# Patient Record
Sex: Female | Born: 1970
Health system: Southern US, Community
[De-identification: ages and names within clinical notes are randomized; demographics above are authoritative.]

## PROBLEM LIST (undated history)

## (undated) DIAGNOSIS — B86 Scabies: Secondary | ICD-10-CM

## (undated) DIAGNOSIS — J302 Other seasonal allergic rhinitis: Secondary | ICD-10-CM

## (undated) HISTORY — DX: Scabies: B86

## (undated) HISTORY — PX: CERVICAL BIOPSY  W/ LOOP ELECTRODE EXCISION: SUR135

## (undated) HISTORY — PX: WISDOM TOOTH EXTRACTION: SHX21

## (undated) HISTORY — DX: Other seasonal allergic rhinitis: J30.2

## (undated) HISTORY — PX: COLPOSCOPY: SHX161

## (undated) HISTORY — PX: APPENDECTOMY: SHX54

## (undated) HISTORY — PX: PELVIC LAPAROSCOPY: SHX162

---

## 1998-02-02 ENCOUNTER — Other Ambulatory Visit: Admission: RE | Admit: 1998-02-02 | Discharge: 1998-02-02 | Payer: Self-pay | Admitting: Gynecology

## 1998-11-08 ENCOUNTER — Encounter: Payer: Self-pay | Admitting: Gynecology

## 1998-11-08 ENCOUNTER — Ambulatory Visit (HOSPITAL_COMMUNITY): Admission: RE | Admit: 1998-11-08 | Discharge: 1998-11-08 | Payer: Self-pay | Admitting: Gynecology

## 1999-03-15 ENCOUNTER — Other Ambulatory Visit: Admission: RE | Admit: 1999-03-15 | Discharge: 1999-03-15 | Payer: Self-pay | Admitting: Gynecology

## 1999-07-24 ENCOUNTER — Encounter (INDEPENDENT_AMBULATORY_CARE_PROVIDER_SITE_OTHER): Payer: Self-pay | Admitting: Specialist

## 1999-07-24 ENCOUNTER — Ambulatory Visit (HOSPITAL_COMMUNITY): Admission: RE | Admit: 1999-07-24 | Discharge: 1999-07-24 | Payer: Self-pay | Admitting: Gynecology

## 2000-01-24 ENCOUNTER — Inpatient Hospital Stay (HOSPITAL_COMMUNITY): Admission: AD | Admit: 2000-01-24 | Discharge: 2000-01-24 | Payer: Self-pay | Admitting: Gynecology

## 2000-02-17 ENCOUNTER — Encounter: Payer: Self-pay | Admitting: Obstetrics and Gynecology

## 2000-02-17 ENCOUNTER — Inpatient Hospital Stay (HOSPITAL_COMMUNITY): Admission: AD | Admit: 2000-02-17 | Discharge: 2000-02-17 | Payer: Self-pay | Admitting: *Deleted

## 2000-02-17 ENCOUNTER — Ambulatory Visit (HOSPITAL_COMMUNITY): Admission: RE | Admit: 2000-02-17 | Discharge: 2000-02-17 | Payer: Self-pay | Admitting: Gynecology

## 2000-03-13 ENCOUNTER — Inpatient Hospital Stay (HOSPITAL_COMMUNITY): Admission: AD | Admit: 2000-03-13 | Discharge: 2000-03-13 | Payer: Self-pay | Admitting: Gynecology

## 2001-08-05 ENCOUNTER — Other Ambulatory Visit: Admission: RE | Admit: 2001-08-05 | Discharge: 2001-08-05 | Payer: Self-pay | Admitting: Gynecology

## 2001-09-09 ENCOUNTER — Encounter: Admission: RE | Admit: 2001-09-09 | Discharge: 2001-12-08 | Payer: Self-pay | Admitting: Gynecology

## 2001-12-24 ENCOUNTER — Encounter (HOSPITAL_COMMUNITY): Admission: AD | Admit: 2001-12-24 | Discharge: 2002-01-23 | Payer: Self-pay | Admitting: Gynecology

## 2002-01-20 ENCOUNTER — Inpatient Hospital Stay (HOSPITAL_COMMUNITY): Admission: AD | Admit: 2002-01-20 | Discharge: 2002-01-23 | Payer: Self-pay | Admitting: Gynecology

## 2002-01-20 ENCOUNTER — Encounter (INDEPENDENT_AMBULATORY_CARE_PROVIDER_SITE_OTHER): Payer: Self-pay | Admitting: *Deleted

## 2002-01-24 ENCOUNTER — Encounter: Admission: RE | Admit: 2002-01-24 | Discharge: 2002-02-23 | Payer: Self-pay | Admitting: Gynecology

## 2002-03-03 ENCOUNTER — Other Ambulatory Visit: Admission: RE | Admit: 2002-03-03 | Discharge: 2002-03-03 | Payer: Self-pay | Admitting: Gynecology

## 2003-03-09 ENCOUNTER — Other Ambulatory Visit: Admission: RE | Admit: 2003-03-09 | Discharge: 2003-03-09 | Payer: Self-pay | Admitting: Gynecology

## 2003-09-13 ENCOUNTER — Other Ambulatory Visit: Admission: RE | Admit: 2003-09-13 | Discharge: 2003-09-13 | Payer: Self-pay | Admitting: Gynecology

## 2004-03-27 ENCOUNTER — Inpatient Hospital Stay (HOSPITAL_COMMUNITY): Admission: RE | Admit: 2004-03-27 | Discharge: 2004-03-30 | Payer: Self-pay | Admitting: Gynecology

## 2004-03-27 ENCOUNTER — Encounter (INDEPENDENT_AMBULATORY_CARE_PROVIDER_SITE_OTHER): Payer: Self-pay | Admitting: *Deleted

## 2004-05-07 ENCOUNTER — Other Ambulatory Visit: Admission: RE | Admit: 2004-05-07 | Discharge: 2004-05-07 | Payer: Self-pay | Admitting: Gynecology

## 2005-04-08 HISTORY — PX: ENDOMETRIAL ABLATION: SHX621

## 2005-05-08 ENCOUNTER — Other Ambulatory Visit: Admission: RE | Admit: 2005-05-08 | Discharge: 2005-05-08 | Payer: Self-pay | Admitting: Gynecology

## 2005-06-17 ENCOUNTER — Ambulatory Visit (HOSPITAL_COMMUNITY): Admission: RE | Admit: 2005-06-17 | Discharge: 2005-06-17 | Payer: Self-pay | Admitting: Gynecology

## 2005-06-17 ENCOUNTER — Encounter (INDEPENDENT_AMBULATORY_CARE_PROVIDER_SITE_OTHER): Payer: Self-pay | Admitting: *Deleted

## 2006-05-12 ENCOUNTER — Other Ambulatory Visit: Admission: RE | Admit: 2006-05-12 | Discharge: 2006-05-12 | Payer: Self-pay | Admitting: Gynecology

## 2006-09-04 ENCOUNTER — Emergency Department (HOSPITAL_COMMUNITY): Admission: EM | Admit: 2006-09-04 | Discharge: 2006-09-04 | Payer: Self-pay | Admitting: Emergency Medicine

## 2006-09-23 ENCOUNTER — Encounter: Admission: RE | Admit: 2006-09-23 | Discharge: 2006-09-23 | Payer: Self-pay | Admitting: Gynecology

## 2007-05-15 ENCOUNTER — Other Ambulatory Visit: Admission: RE | Admit: 2007-05-15 | Discharge: 2007-05-15 | Payer: Self-pay | Admitting: Gynecology

## 2007-09-24 ENCOUNTER — Encounter: Admission: RE | Admit: 2007-09-24 | Discharge: 2007-09-24 | Payer: Self-pay | Admitting: Gynecology

## 2008-02-07 IMAGING — MG MM SCREEN MAMMOGRAM BILATERAL
4 series · 4 of 4 positions shown · non-contrast
Comparison: none

DG SCREEN MAMMOGRAM BILATERAL
Bilateral CC and MLO view(s) were taken.

DIGITAL SCREENING MAMMOGRAM WITH CAD:
There is a  dense fibroglandular pattern.  No masses or malignant type calcifications are 
identified.  Compared with prior studies.

[R CC]
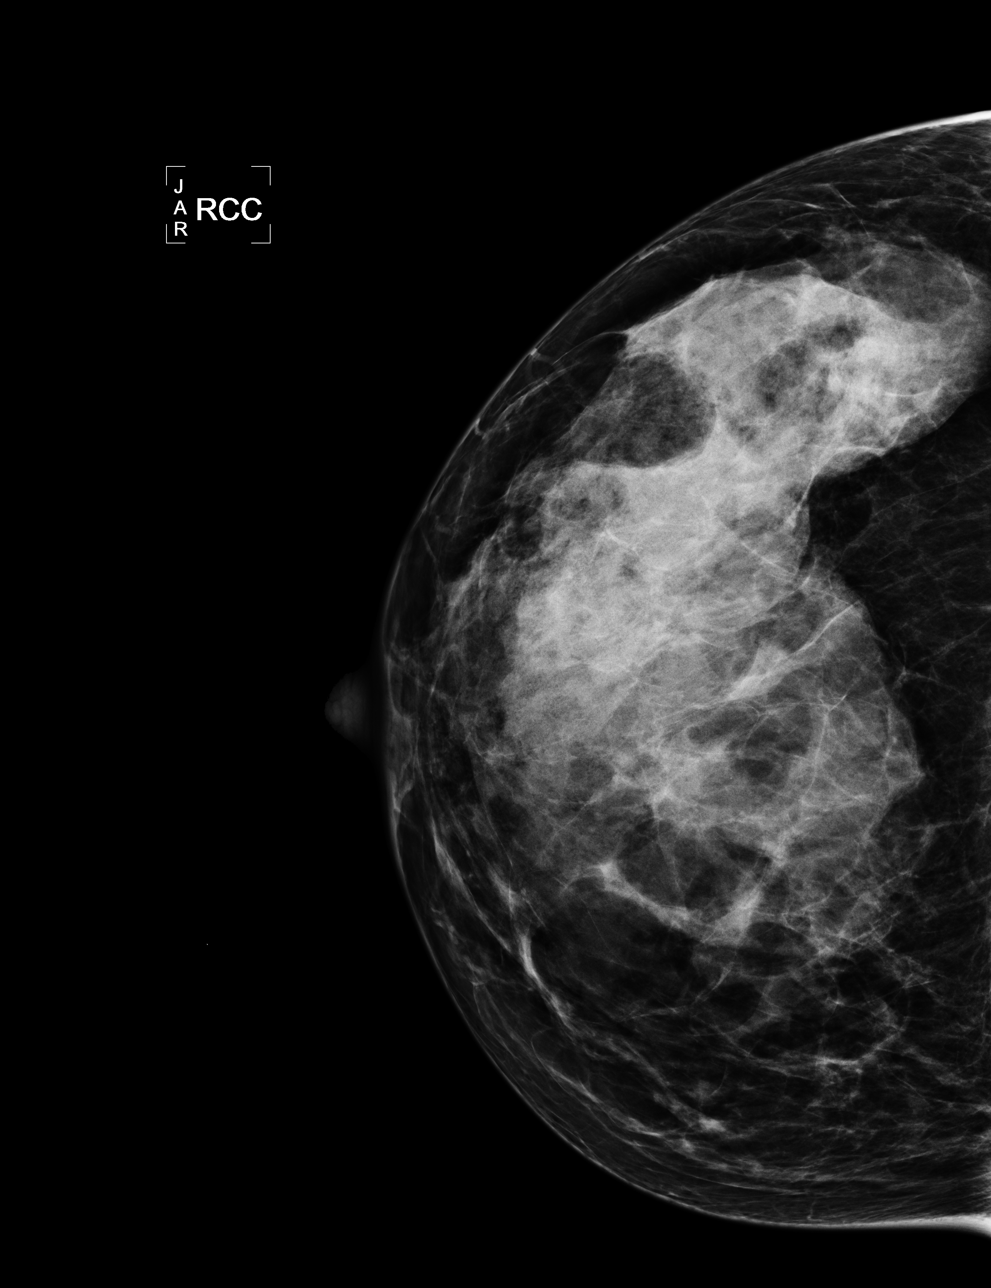

[L CC]
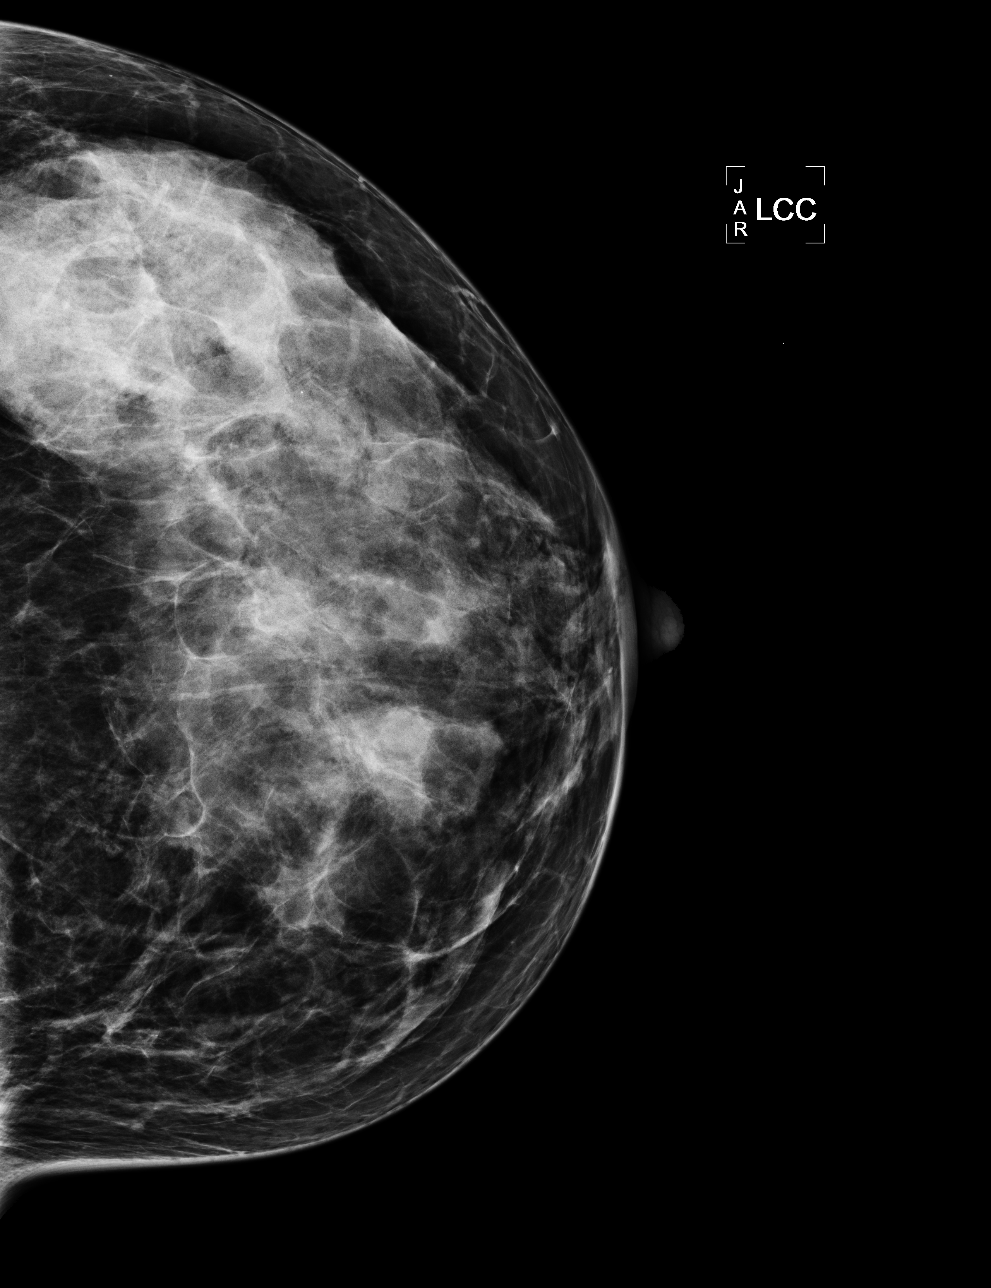

[L MLO]
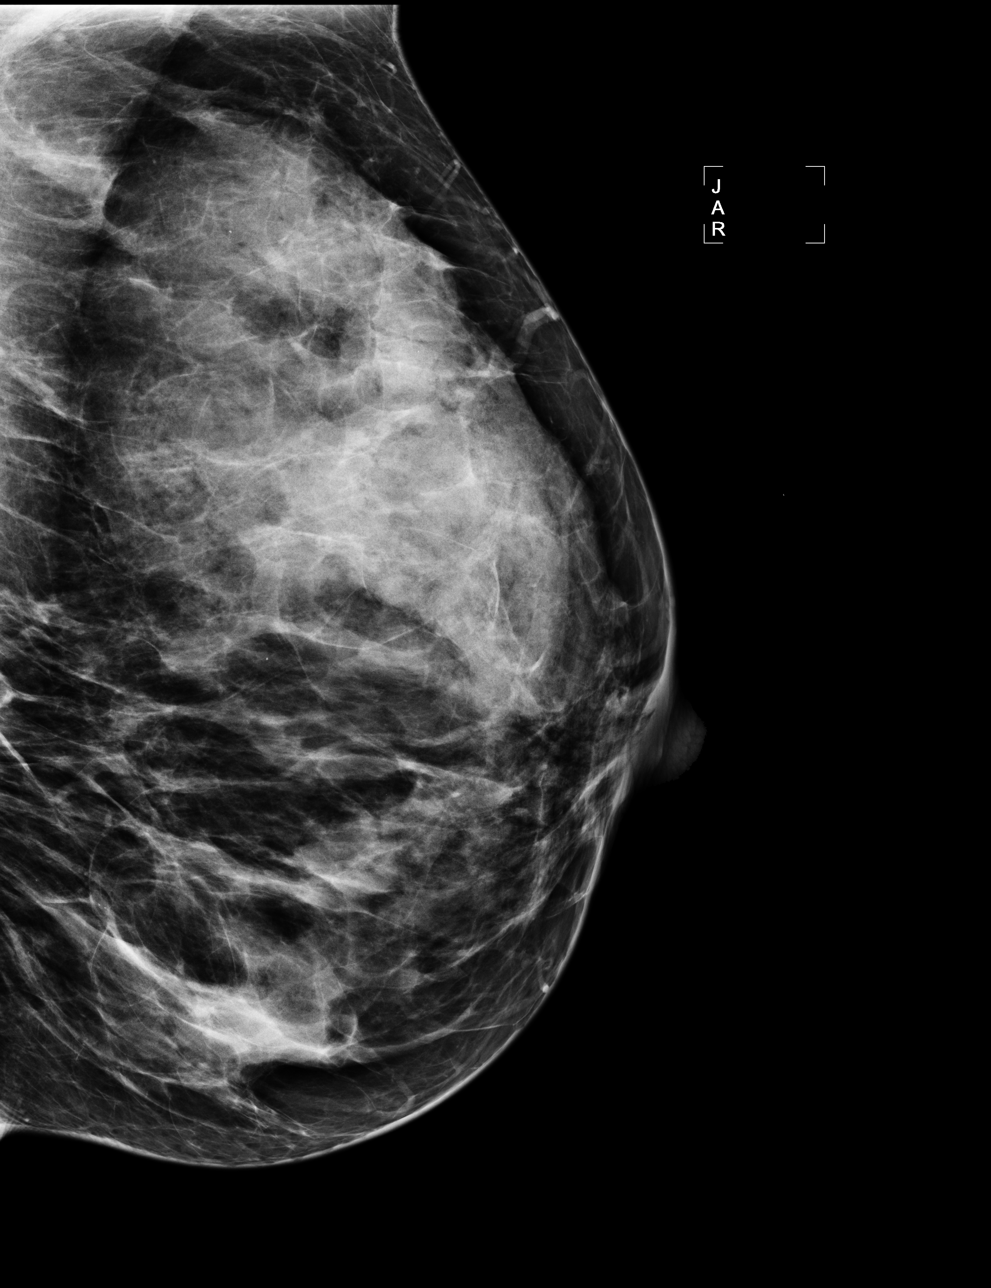

[R MLO]
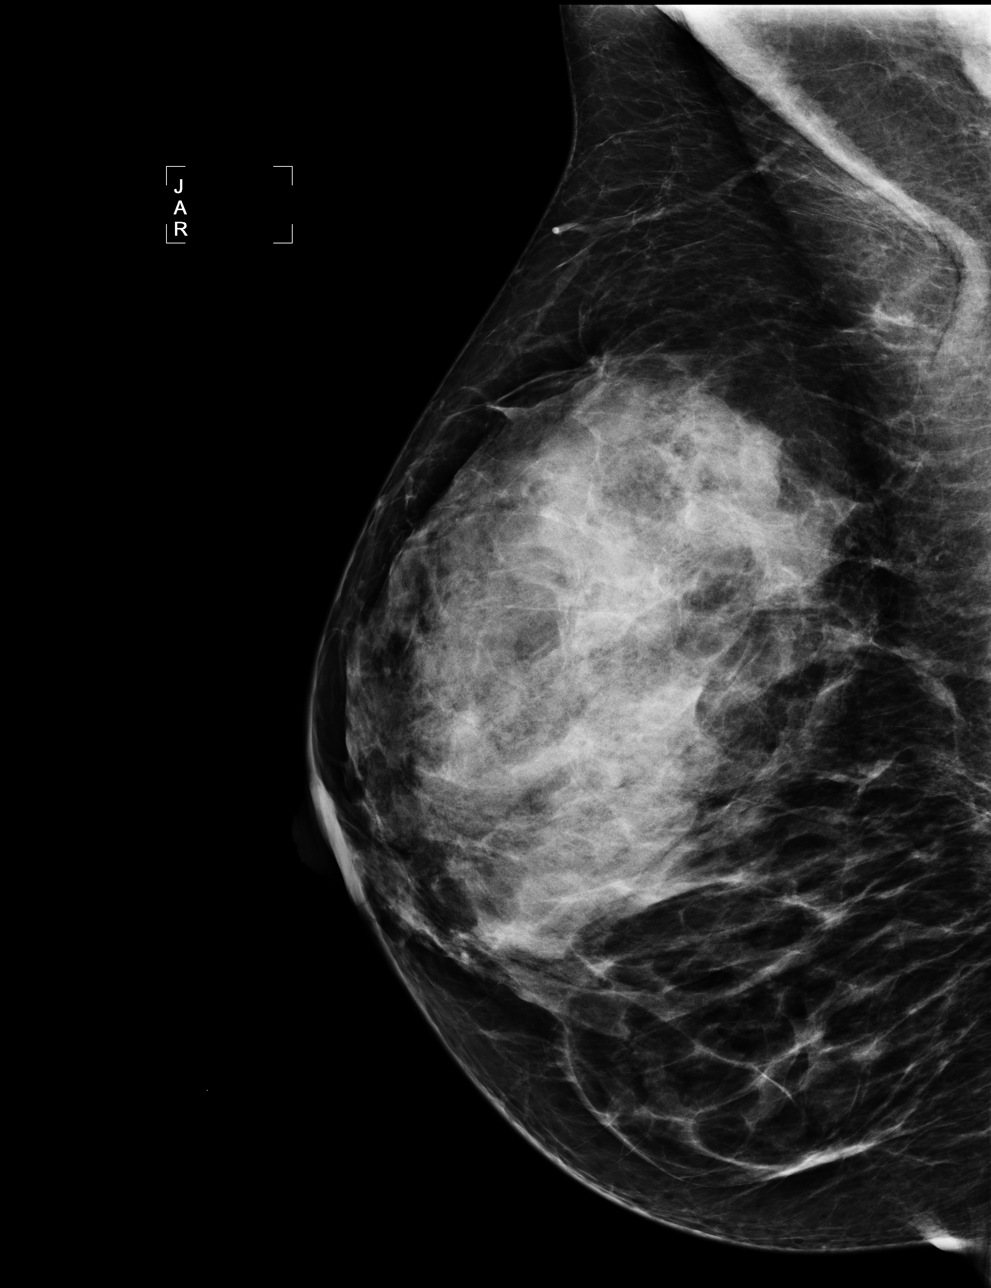

[4 of 4 positions shown; findings below may reference images not displayed]

IMPRESSION: No specific mammographic evidence of malignancy.  Next screening mammogram is recommended in one 
year.

ASSESSMENT: Negative - BI-RADS 1

Screening mammogram in 1 year.
ANALYZED BY COMPUTER AIDED DETECTION. , THIS PROCEDURE WAS A DIGITAL MAMMOGRAM.

## 2008-06-28 ENCOUNTER — Other Ambulatory Visit: Admission: RE | Admit: 2008-06-28 | Discharge: 2008-06-28 | Payer: Self-pay | Admitting: Gynecology

## 2008-06-28 ENCOUNTER — Encounter: Payer: Self-pay | Admitting: Gynecology

## 2008-06-28 ENCOUNTER — Ambulatory Visit: Payer: Self-pay | Admitting: Gynecology

## 2008-09-26 ENCOUNTER — Encounter: Admission: RE | Admit: 2008-09-26 | Discharge: 2008-09-26 | Payer: Self-pay | Admitting: Gynecology

## 2009-08-01 ENCOUNTER — Ambulatory Visit: Payer: Self-pay | Admitting: Gynecology

## 2009-08-01 ENCOUNTER — Other Ambulatory Visit: Admission: RE | Admit: 2009-08-01 | Discharge: 2009-08-01 | Payer: Self-pay | Admitting: Gynecology

## 2009-10-23 ENCOUNTER — Encounter: Admission: RE | Admit: 2009-10-23 | Discharge: 2009-10-23 | Payer: Self-pay | Admitting: Gynecology

## 2009-12-18 ENCOUNTER — Ambulatory Visit: Payer: Self-pay | Admitting: Gynecology

## 2010-01-03 ENCOUNTER — Ambulatory Visit: Payer: Self-pay | Admitting: Gynecology

## 2010-01-05 ENCOUNTER — Ambulatory Visit: Payer: Self-pay | Admitting: Gynecology

## 2010-01-09 ENCOUNTER — Emergency Department (HOSPITAL_COMMUNITY): Admission: EM | Admit: 2010-01-09 | Discharge: 2010-01-10 | Payer: Self-pay | Admitting: Emergency Medicine

## 2010-01-11 ENCOUNTER — Ambulatory Visit: Payer: Self-pay | Admitting: Women's Health

## 2010-01-29 ENCOUNTER — Ambulatory Visit: Payer: Self-pay | Admitting: Gynecology

## 2010-02-23 ENCOUNTER — Ambulatory Visit: Payer: Self-pay | Admitting: Gynecology

## 2010-03-08 HISTORY — PX: LAPAROSCOPIC TOTAL HYSTERECTOMY: SUR800

## 2010-03-13 ENCOUNTER — Ambulatory Visit: Payer: Self-pay | Admitting: Gynecology

## 2010-03-21 ENCOUNTER — Encounter: Payer: Self-pay | Admitting: Gynecology

## 2010-03-21 ENCOUNTER — Ambulatory Visit: Payer: Self-pay | Admitting: Gynecology

## 2010-03-21 ENCOUNTER — Ambulatory Visit (HOSPITAL_COMMUNITY)
Admission: RE | Admit: 2010-03-21 | Discharge: 2010-03-22 | Payer: Self-pay | Source: Home / Self Care | Attending: Surgery | Admitting: Surgery

## 2010-04-09 ENCOUNTER — Ambulatory Visit: Payer: Self-pay | Admitting: Gynecology

## 2010-04-24 ENCOUNTER — Ambulatory Visit
Admission: RE | Admit: 2010-04-24 | Discharge: 2010-04-24 | Payer: Self-pay | Source: Home / Self Care | Attending: Gynecology | Admitting: Gynecology

## 2010-06-18 LAB — CBC
HCT: 34.3 % — ABNORMAL LOW (ref 36.0–46.0)
Hemoglobin: 11.4 g/dL — ABNORMAL LOW (ref 12.0–15.0)
MCH: 29.5 pg (ref 26.0–34.0)
MCHC: 33.2 g/dL (ref 30.0–36.0)
MCV: 88.9 fL (ref 78.0–100.0)
RDW: 13.3 % (ref 11.5–15.5)

## 2010-06-19 LAB — CBC
HCT: 41.8 % (ref 36.0–46.0)
Hemoglobin: 14.3 g/dL (ref 12.0–15.0)
MCH: 30.2 pg (ref 26.0–34.0)
MCHC: 34.2 g/dL (ref 30.0–36.0)
MCV: 88.4 fL (ref 78.0–100.0)
RDW: 13.3 % (ref 11.5–15.5)

## 2010-06-21 LAB — URINALYSIS, ROUTINE W REFLEX MICROSCOPIC
Nitrite: NEGATIVE
Specific Gravity, Urine: 1.019 (ref 1.005–1.030)
Urobilinogen, UA: 0.2 mg/dL (ref 0.0–1.0)
pH: 5.5 (ref 5.0–8.0)

## 2010-06-21 LAB — COMPREHENSIVE METABOLIC PANEL
AST: 18 U/L (ref 0–37)
Albumin: 4.3 g/dL (ref 3.5–5.2)
BUN: 9 mg/dL (ref 6–23)
CO2: 26 mEq/L (ref 19–32)
Calcium: 9.7 mg/dL (ref 8.4–10.5)
Chloride: 102 mEq/L (ref 96–112)
Creatinine, Ser: 0.64 mg/dL (ref 0.4–1.2)
GFR calc Af Amer: >60 mL/min (ref >60)
GFR calc non Af Amer: >60 mL/min (ref >60)
Total Bilirubin: 1.4 mg/dL — ABNORMAL HIGH (ref 0.3–1.2)

## 2010-06-21 LAB — DIFFERENTIAL
Basophils Absolute: 0 10*3/uL (ref 0.0–0.1)
Eosinophils Relative: 1 % (ref 0–5)
Lymphocytes Relative: 26 % (ref 12–46)
Lymphs Abs: 1.6 10*3/uL (ref 0.7–4.0)
Neutro Abs: 4 10*3/uL (ref 1.7–7.7)

## 2010-06-21 LAB — CBC
MCH: 30.3 pg (ref 26.0–34.0)
MCHC: 34.7 g/dL (ref 30.0–36.0)
MCV: 87.2 fL (ref 78.0–100.0)
Platelets: 204 10*3/uL (ref 150–400)

## 2010-06-21 LAB — URINE MICROSCOPIC-ADD ON

## 2010-06-21 LAB — PREGNANCY, URINE: Preg Test, Ur: NEGATIVE

## 2010-08-24 NOTE — Op Note (Signed)
[facility]  Patient:    Kristin Cain, Kristin Cain                      MRN: [id number] Proc. Date: 07/24/99 Adm. Date:  [id number] Attending:  Merrily Pew                           Operative Report  PREOPERATIVE DIAGNOSIS:       Increasing dysmenorrhea, menorrhagia.  POSTOPERATIVE DIAGNOSIS:      Endometriosis, leiomyomata.  OPERATION:                    Laparoscopic biopsy and carbon dioxide laser ablation endometriosis, chromopertubation.  SURGEON:                      Timothy P. Fontaine, M.D.  ASSISTANT:  ANESTHESIA:                   General endotracheal anesthesia.  COMPLICATIONS:                None.  ESTIMATED BLOOD LOSS:         Minimal.  SPECIMEN:                     Peritoneal biopsies.  FINDINGS:                     Anterior cul-de-sac normal.  Posterior cul-de-sac  with multiple classic endometriotic implants from left side wall, cul-de-sac, and right side wall.  Combination of red, white, and powderburn lesions.  All active areas ablated with carbon dioxide laser.  Representative sample biopsy x 2 taken. Uterus grossly normal in size.  Three subserosal to pedunculated leiomyomata, two on the midlower posterior surface and one on the left fundal region left undisturbed.  Right and left fallopian tubes normal length, caliber, and fimbriated ends with positive fill and spill of dye.  Right and left ovaries grossly normal, free, and mobile.  Appendix grossly normal.  Liver smooth with no abnormalities. Gallbladder grossly normal to limited inspection.  DESCRIPTION OF PROCEDURE:     The patient was taken to the operating room and underwent general endotracheal anesthesia and was placed in the low dorsal lithotomy position and received an abdominal, perineal, and vaginal preparation  with Betadine scrub and Betadine solution.  Bladder emptied with in-and-out Foley catheterization and a Cohen cannula was placed on the  cervix with a single tooth tenaculum.  The patient was draped in the usual fashion.  A vertical infraumbilical incision was made.  The Veress needle was placed.  Its position verified with water and 2 liters of carbon dioxide gas was infused without difficulty.  The 10 mm laparoscopic trocar was then placed without difficulty and its position verified visually.  A 5 mm suprapubic midline port was placed without difficulty and under direct visualization after transillumination for the vessels.  Examination of pelvic organs and upper abdominal examination was then carried out with findings noted above.  Chromopertubation was then carried out with positive fill and spill of both fallopian tubes.  Subsequently, the Cohen cannula was replaced with a Hulka tenaculum for better manipulation and two representative areas of the posterior  cul-de-sac endometriosis were biopsied.  Subsequently after assuring adequate operating room laser safety, the carbon dioxide laser 20 watts continuous power was then used to ablate all visible lesions.  The left  ureter was visible and was away from the left pelvic side wall lasered areas.  The right ureter was clearly identified and there were two small implants that were found to overly the peritoneum over the lower ureter.  These were both superficial and allowed for manipulation of the ureter to deviate from below the peritoneum allowing for ablation without evidence of ureteral compromise.  The pelvis was copiously irrigated with removal of the char.  No active bleeding was visualized.  The suprapubic port was then removed.  The gas allowed to escape.  All areas were inspected under low pressure situation showing adequate hemostasis.  The infraumbilical port was then backed out under direct visualization showing adequate hemostasis and no evidence of hernia formation.  The infraumbilical incision was closed using 0 Vicryl with an interrupted  subcutaneous fascial stitch.  Both skin incisions were infiltrated using 0.25% Marcaine and the infraumbilical skin incision was closed using 4-0 Vicryl in interrupted subcuticular stitch.  The suprapubic port closed using 4-0 Vicryl in interrupted cuticular stitch.  The sterile dressings were applied.  Hulka tenaculum removed.  The patient was placed in the supine position and taken to the recovery room in good condition having tolerated the procedure well. DD:  07/24/99 TD:  07/24/99 Job: [id number]

## 2010-08-24 NOTE — Discharge Summary (Signed)
Kristin Cain, Kristin Cain               ACCOUNT NO.:  [id number]   MEDICAL RECORD NO.:  [id number]          PATIENT TYPE:  INP   LOCATION:  9114                          FACILITY:  [facility]   PHYSICIAN:  Timothy P. Fontaine, M.D.DATE OF BIRTH:  Aug 20, 1970   DATE OF ADMISSION:  03/27/2004  DATE OF DISCHARGE:  03/30/2004                                 DISCHARGE SUMMARY   DISCHARGE DIAGNOSES:  1.  Pregnancy at term.  2.  Prior cesarean section, desires repeat cesarean section.  3.  Desires permanent sterilization.  4.  Leiomyomata.   PROCEDURE:  Repeat low transverse cervical cesarean section, bilateral tubal  sterilization - March 27, 2004.  Pathology [id number]:  Fallopian tubes,  two segments, complete transection, no pathologic abnormalities.   HOSPITAL COURSE:  The patient was admitted for repeat cesarean section and  tubal sterilization.  She underwent the same December 20 without  complications and her postoperative course was uncomplicated.  She was  discharged on postoperative day #3 ambulating well, tolerating a regular  diet, with a postoperative hemoglobin of 12.6.  The patient is Rh negative;  her infant is also Rh negative.  The patient received precautions,  instructions, and follow-up.  She will be seen in the office in 6 weeks and  was given a prescription for Tylox #20 one to two p.o. q.4-6h. p.r.n. pain.  The patient's incision was closed with a running subcuticular absorbable  suture and did not require staple removal.     Timo   TPF/MEDQ  D:  03/30/2004  T:  03/31/2004  Job:  [id number]

## 2010-08-24 NOTE — H&P (Signed)
NAMEFELICIE, Kristin Cain               ACCOUNT NO.:  [id number]   MEDICAL RECORD NO.:  [id number]          PATIENT TYPE:  AMB   LOCATION:  SDC                           FACILITY:  [facility]   PHYSICIAN:  Timothy P. Fontaine, M.D.DATE OF BIRTH:  1971/03/24   DATE OF ADMISSION:  DATE OF DISCHARGE:                                HISTORY & PHYSICAL   CHIEF COMPLAINT:  Menorrhagia.   HISTORY OF PRESENT ILLNESS:  This is a 40 year old G2, P3, female, status  post tubal sterilization, who is admitted for removal of endometrial polyps,  endometrial ablation, due to menorrhagia.  The patient notes periods are  progressively getting worse.  They are now becoming socially unacceptable.  Her evaluation included normal thyroid, hemoglobin of 15, and a  sonohysterogram which shows several small endometrial defects consistent  with polyps.  The patient is admitted at this time for removal of the polyps  and subsequent NovaSure endometrial ablation.   PAST MEDICAL HISTORY:  Uncomplicated.   PAST SURGICAL HISTORY:  1.  Cesarean section x2 with BTL.  2.  Laparoscopy.  3.  Cervical LEEP excision.  4.  Oral surgery.   ALLERGIES:  Allergies to no medications.   CURRENT MEDICATIONS:  None.   REVIEW OF SYSTEMS:  Noncontributory.   FAMILY HISTORY:  Noncontributory.   SOCIAL HISTORY:  Noncontributory.   PHYSICAL EXAMINATION:  VITAL SIGNS:  Afebrile.  Vital signs are stable.  HEENT:  Normal.  LUNGS:  Clear.  CARDIAC:  Regular rate without rubs, murmurs, or gallops.  ABDOMEN:  Exam benign.  PELVIC:  External BUS, vagina normal.  Cervix normal.  Uterus normal size,  midline and mobile.  Adnexa without masses or tenderness.   ASSESSMENT:  A 40 year old G2, P97 female, status post tubal sterilization  with increasing menorrhagia.  Normal thyroid, normal hemoglobin.  Sonohysterogram is showing several small polyps.  Admitted for hysteroscopy,  removal of the polyps, endometrial ablation.  I reviewed with  the patient  the situation in detail and the issues as to whether to proceed with  hysteroscopy, removal of the polyps, await final pathology, and then  tentatively plan ablation at a future date versus proceeding with ablation,  recognizing if surprise findings on final pathology, we may have to discuss  further therapy following the procedure.  The patient understands the issues  and wants to proceed with removal of the polyps, ablation all at the same  time.  I reviewed the expected intraoperative/postoperative courses,  including instrumentation, use of the resectoscope, D&C, use of the NovaSure  endometrial ablation equipment.  She understands that there are no  guarantees as far as menorrhagia relief.  Her periods may continue to be  heavy following the procedure.  She also understands that she should never  achieve pregnancy following this, and although she does have a tubal  sterilization, she should never consider pregnancy in the future.  She also  understands that if the procedure is machine dependent, if there is a  malfunction in the NovaSure equipment, that we would not be able to proceed  with the ablation.  The acute  intraoperative/postoperative courses were  discussed as well as the risks including infection, prolonged antibiotics,  hemorrhage, necessitating transfusion, and the risks of transfusion.  The  risks of uterine perforation either during hysteroscopy/D&C or NovaSure  portion of the procedure were reviewed, and the risk of damaging internal  organs including bowel, bladder, ureters, vessels, and nerves, necessitating  major exploratory reparative surgeries and future reparative surgeries,  including ostomy formation, was all discussed, understood, and accepted.  She understands there is a risk of thermal damage from the equipment during  the procedure and the risk of bowel injury from this was discussed,  understood, and accepted.  The patient's questions were  answered to her  satisfaction and she is ready to proceed with surgery.      Timothy P. Fontaine, M.D.  Electronically Signed     TPF/MEDQ  D:  06/16/2005  T:  06/16/2005  Job:  [id number]

## 2010-08-24 NOTE — Op Note (Signed)
NAME:  Kristin Cain, Kristin Cain                         ACCOUNT NO.:  [id number]   MEDICAL RECORD NO.:  [id number]                   PATIENT TYPE:  INP   LOCATION:  9198                                 FACILITY:  [facility]   PHYSICIAN:  Timothy P. Fontaine, M.D.           DATE OF BIRTH:  05/17/1970   DATE OF PROCEDURE:  01/20/2002  DATE OF DISCHARGE:                                 OPERATIVE REPORT   PREOPERATIVE DIAGNOSES:  1. Twins at 36-37 weeks.  2. Vertex breech presentation.  3. Pregnancy induced hypertension.  4. Leiomyomata.   POSTOPERATIVE DIAGNOSES:  1. Twins at 36-37 weeks.  2. Vertex breech presentation.  3. Pregnancy induced hypertension.  4. Leiomyomata.   PROCEDURE:  Primary low transverse cervical cesarean section.   SURGEON:  Timothy P. Fontaine, M.D.   ASSISTANT:  Ivor Costa. Farrel Gobble, M.D.   ANESTHESIA:  Regional.   ESTIMATED BLOOD LOSS:  Less than 500 cc.   COMPLICATIONS:  None.   SPECIMENS:  Samples of cord blood twin A.  Twin B unobtainable placenta.   FINDINGS:  75 twin A female infant.  Apgars 8 and 9.  Weight pending.  14  twin B female infant.  Apgars 8 and 9.  Weight pending.  Multiple subserosal  pedunculated leiomyomata.  Pelvic anatomy otherwise noted to be normal.   PROCEDURE:  The patient was taken to the operating room.  Underwent regional  anesthesia.  Was placed in the left tilt supine position.  Received an  abdominal preparation with Betadine scrub and Betadine solution.  Bladder  emptied with indwelling Foley catheterization.  Placed in sterile technique.  The patient was draped in usual fashion.  After assuring adequate anesthesia  the abdomen was sharply entered through a Pfannenstiel incision achieving  adequate hemostasis at all levels.  The bladder flap was sharply, bluntly  developed without difficulty and the uterus was sharply entered in the lower  uterine segment and bluntly extended laterally.  The bulging membranes were  ruptured,  the fluid noted to be clear, and twin A was found to be in the  vertex presentation.  The head was delivered, nares and mouth suctioned, a  double nuchal cord reduced.  The rest of the infant delivered.  The cord  doubly clamped and cut and the infant handed to pediatrics in attendance.  Twin B was found to be in the breech presentation.  The membranes were  ruptured and the infant underwent a direct breech extraction without  difficulty.  The nares and mouth suctioned.  The cord doubly clamped and cut  and the infant handed to pediatrics in attendance.  Samples of cord blood  from twin A were obtained.  From twin B this was attempted, although due to  the small vasculature, there was no blood obtained.  A cord clamp was placed  on twin A for identification for pathology reasons.  The placenta was then  spontaneously extruded, noted  to be intact, and was sent to pathology.  The  uterus was exteriorized, endometrial cavity explored with sponge to remove  all placental and membrane fragments.  The uterine incision was closed in  two layers using 0 Vicryl suture first in a running interlocking stitch  followed by an embrocating stitch.  There was some mild bleeding from one of  the pedunculated leiomyomata and the major contributing vessels were  cauterized to achieve adequate hemostasis.  The uterus was then replaced in  the abdomen, the site on the leiomyomata reinspected showing adequate  hemostasis, and subsequently the abdomen was copiously irrigated showing  adequate hemostasis at the incision line.  The anterior fascia was then  reapproximated using 0 Vicryl suture in a running stitch.  Subcutaneous  tissue was irrigated.  Adequate hemostasis achieved with electrocautery, the  skin reapproximated with staples, a sterile dressing applied, the patient  taken to recovery room in good condition having tolerated procedure   Dictation ended at this point.                                                Timothy P. Audie Box, M.D.    TPF/MEDQ  D:  01/20/2002  T:  01/20/2002  Job:  [id number]

## 2010-08-24 NOTE — H&P (Signed)
NAMECHANNA, Kristin Cain               ACCOUNT NO.:  [id number]   MEDICAL RECORD NO.:  [id number]          PATIENT TYPE:  INP   LOCATION:  NA                            [facility]   PHYSICIAN:  Timothy P. Fontaine, M.D.DATE OF BIRTH:  1970/05/24   DATE OF ADMISSION:  DATE OF DISCHARGE:                                HISTORY & PHYSICAL   CHIEF COMPLAINT:  Pregnancy at term, prior cesarean section, desires repeat  cesarean section, tubal sterilization.   HISTORY OF PRESENT ILLNESS:  A 40 year old G2, P1 living 2 female, history  of prior cesarean section who desires repeat cesarean section, tubal  sterilization after discussion for alternatives to include a trial labor.  For the remainder of her history, see her [place].   PHYSICAL EXAMINATION:  HEENT:  Normal.  LUNGS:  Clear.  CARDIAC:  Regular rate and rhythm.  No rubs, murmurs or gallops.  ABDOMEN:  Gravid, vertex fetus, appropriate for term, positive fetal heart  tones.  PELVIC:  Deferred.   ASSESSMENT/PLAN:  A 40 year old, G2, P1 living 2 female prior cesarean  section who desires repeat cesarean section, tubal sterilization.  The  risks, benefits indications, and alternatives for the procedure were  reviewed with her to include the risks of infection both internal requiring  prolonged antibiotics, abscess formation requiring reoperation, abscess  drainage as well as wound complications requiring opening and draining of  incisions, closure by secondary intention.  The risks of hemorrhage  necessitating transfusion and the risks of transfusion were reviewed with  her.  The risks of inadvertent injury to internal organs include bowel,  bladder, ureters, vessels, and nerves necessitating major exploratory repair  to surgeries and featured repair of surgeries including ostomy formation was  all discussed and has been accepted.  The risks of fetal injury including  musculoskeletal, neuroskeletal injuries were discussed, the  permanency of  tubal sterilization was reviewed as well as the risk of failure and she  understands and accepts these issues.  The patient's questions were answered  to her satisfaction and she was ready to proceed with surgery.     Timo   TPF/MEDQ  D:  03/19/2004  T:  03/19/2004  Job:  [id number]

## 2010-08-24 NOTE — Op Note (Signed)
NAMEJAIDALYN, Kristin Cain               ACCOUNT NO.:  [id number]   MEDICAL RECORD NO.:  [id number]          PATIENT TYPE:  AMB   LOCATION:  SDC                           [facility]ILITY[facility]   PHYSICIAN:  Timothy P. Fontaine, M.D.DATE OF BIRTH:  1970-11-16   DATE OF PROCEDURE:  06/17/2005  DATE OF DISCHARGE:                                 OPERATIVE REPORT   PREOPERATIVE DIAGNOSES:  Menorrhagia, endometrial polyps.   POSTOPERATIVE DIAGNOSES:  Menorrhagia, endometrial polyps.   PROCEDURE:  Hysteroscopy, D&C and NovaSure endometrial ablation.   SURGEON:  Dr. Audie Box.   ANESTHETIC:  General with 1% lidocaine paracervical block.   SPECIMEN:  Endometrial curetting.   ESTIMATED BLOOD LOSS:  Minimal.   COMPLICATIONS:  None.   SORBITOL DISCREPANCY:  65 mL   FINDINGS:  EUA, external BUS and vagina normal. Cervix normal. Uterus normal  size, midline and mobile, retroverted, adnexa without masses. Hysteroscopic;  multiple endometrial undulations consistent with small polyps posterior  cavity removed with a sharp curettage. Subsequent hysteroscopy showed empty  cavity. Right and left tubal ostia, fundus, anterior-posterior surface,  lower uterine segment all visualized and normal.   PROCEDURE:  The patient was taken to the operating room, underwent general  anesthesia, was placed low dorsal lithotomy position, received a perineal  vaginal preparation with Betadine solution. Bladder emptied with in-and-out  Foley catheterization per nursing personnel. The patient was draped in usual  fashion. EUA performed. Subsequently cervix visualized with a speculum.  Anterior lip grasped with single-tooth tenaculum and a paracervical block  using 1% lidocaine was placed, total of 10 mL. Uterine cavity was then  sounded. The endocervical length subsequent determined and hysteroscopy was  then performed with findings noted above. Sharp curettage was performed with  abundant return. Rehysteroscopy showed an  empty cavity. Subsequently the  NovaSure instrument was placed within the uterine cavity. The array opened.  Manipulation assuring appropriate placement. Cavity width determined and the  carbon dioxide test was then performed and passed. The NovaSure endometrial  ablation was then performed without difficulty. Subsequently instrument was  removed. Rehysteroscopy showed a normal post ablation appearing cavity. Good  uniform burn. The instruments were all removed. Slight oozing at the  anterior tenaculum site noted. Silver nitrate applied. Ultimate hemostasis  achieved. The patient was placed in supine position, awakened without  difficulty and subsequently taken to recovery room in good condition having  tolerated the procedure well. The device measurements had been recorded and  placed in progress notes. There was a fair amount of sorbitol noted to be on  the floor with recorded discrepancy of 65 mL.      Timothy P. Fontaine, M.D.  Electronically Signed     TPF/MEDQ  D:  06/17/2005  T:  06/18/2005  Job:  [id number]

## 2010-08-24 NOTE — Discharge Summary (Signed)
   NAME:  Kristin Cain, Kristin Cain                         ACCOUNT NO.:  [id number]   MEDICAL RECORD NO.:  [id number]                   PATIENT TYPE:  INP   LOCATION:  9141                                 FACILITY:  [facility]   PHYSICIAN:  Juan H. Lily Peer, M.D.             DATE OF BIRTH:  05-27-1970   DATE OF ADMISSION:  01/20/2002  DATE OF DISCHARGE:  01/23/2002                                 DISCHARGE SUMMARY   DISCHARGE DIAGNOSES:  1. Intrauterine pregnancy twins 36+ weeks delivered.  2. Vertex breech presentation.  3. Pregnancy induced hypertension.  4. Leiomyoma.  5. Status post primary low transverse cesarean section by Timothy P.     Fontaine, M.D. on January 20, 2002.  6. Rh negative.   HISTORY:  This is a 40 year old female gravida 1, para 0 with an EDC of  February 15, 2002.  Prenatal course was complicated with patient being Rh  negative.  Received RhoGAM.  Also with a twin gestation.  Also developed  pregnancy induced hypertension and patient presentation was vertex/breech  presentation and patient desired primary cesarean section.  She also was  known to have a fibroid uterus.   HOSPITAL COURSE:  On January 20, 2002 patient was admitted for primary low  transverse cesarean section by Timothy P. Fontaine, M.D. and underwent  delivery of a female Apgars of 8 and 9, weight 5 pounds 15 ounces and a female  Apgars of 8 and 9, weight 4 pounds 2 ounces.  Postoperatively patient  remained afebrile, voiding, in stable condition.  She was discharged to home  on January 23, 2002 and given [facility] Gynecology postoperative/postpartum  instruction booklet.   ACCESSORY CLINICAL FINDINGS:  Laboratories:  The patient is O-.  Rubella  immune.  Hemoglobin on January 21, 2002 11.7.   DISPOSITION:  The patient is discharged to home.  Informed to return to  office in six weeks.  Any problem prior to that time to be seen in the  office.  Given prescription for Tylox p.r.n. pain.  She was also  given  RhoGAM prior to discharge.     Susa Loffler, P.A.                    Juan H. Lily Peer, M.D.    TSG/MEDQ  D:  02/26/2002  T:  02/26/2002  Job:  [id number]

## 2010-08-24 NOTE — Op Note (Signed)
Kristin Cain, Kristin Cain               ACCOUNT NO.:  [id number]   MEDICAL RECORD NO.:  [id number]          PATIENT TYPE:  INP   LOCATION:  9114                          FACILITY:  [facility]   PHYSICIAN:  Timothy P. Fontaine, M.D.DATE OF BIRTH:  February 27, 1971   DATE OF PROCEDURE:  03/27/2004  DATE OF DISCHARGE:                                 OPERATIVE REPORT   PREOPERATIVE DIAGNOSES:  1.  Term pregnancy.  2.  Prior cesarean section, desires repeat cesarean section.  3.  Multiparous, desires permanent sterilization.   POSTOPERATIVE DIAGNOSES:  1.  Term pregnancy.  2.  Prior cesarean section, desires repeat cesarean section.  3.  Multiparous, desires permanent sterilization.  4.  Leiomyomata.   PROCEDURES:  1.  Repeat low transverse cervical cesarean section.  2.  Bilateral tubal sterilization, modified Pomeroy technique.   SURGEON:  Timothy P. Fontaine, M.D.   ASSISTANTGaetano Hawthorne. Lily Peer, M.D.   ANESTHESIA:  Spinal.   ESTIMATED BLOOD LOSS:  Less than 500 mL.   COMPLICATIONS:  None.   SPECIMENS:  Portions of right and portions of left fallopian tube, samples  of cord blood.   FINDINGS:  At 31, normal female, Apgars 9 and 9, weight 7 pounds 0 ounces.  Uterus noted to have several small subserosal myomas as well as a  pedunculated posterior fundal 3 cm myoma, left undisturbed.  Right and left  ovaries grossly normal.  Right and left fallopian tubes grossly normal.   PROCEDURE:  The patient was taken to the operating room and underwent spinal  anesthesia, was placed in the left tilt supine position and received an  abdominal preparation with Betadine solution, Foley catheter placed in  sterile technique per nursing personnel, and then the patient was draped in  the usual fashion.  After assuring adequate hemostasis, the abdomen was  sharply entered through a repeat Pfannenstiel incision, achieving adequate  hemostasis at all levels.  The bladder flap was sharply and bluntly  developed without difficulty, the uterus was sharply entered in the lower  uterine segment and bluntly extended laterally.  The bulging membranes were  ruptured, the fluid noted to be clear, the infant's head delivered through  the incision and the nares and mouth suctioned.  The rest of the infant  delivered, the cord doubly clamped and cut, and the infant handed to  pediatrics in attendance.  Samples of cord blood were obtained and the  placenta was spontaneously extruded and noted to be intact.  The uterus was  exteriorized, endometrial cavity explored with a sponge to remove all  placental and membrane fragments.  The patient received 1 g Ancef IV  antibiotic prophylaxis at this time.  The uterine incision was closed in one  layer using 0 Vicryl suture in a running interlocking stitch.  The right  fallopian tube was then identified, traced from its insertion to its  fimbriated and a mid-tubal segment was elevated with a Babcock clamp, doubly  ligated using 0 plain suture, and intervening segment excised.  Tubal lumen  as well as adequate hemostasis was grossly visualized.  A similar procedure  was carried out on the other side.  The uterus was returned to the abdomen,  which was copiously irrigated, adequate hemostasis visualized, and the  anterior fascia was reapproximated using 0 Vicryl suture in a running stitch  starting at the angle and meeting in the middle.  Subcutaneous tissues were  irrigated, adequate hemostasis achieved with electrocautery, skin  reapproximated using 4-0 Vicryl in a running subcuticular stitch, Steri-  Strips and Benzoin applied, sterile dressing applied.  The patient taken to  the recovery room in good condition, having tolerated the procedure well.     Timo   TPF/MEDQ  D:  03/27/2004  T:  03/27/2004  Job:  [id number]

## 2010-08-24 NOTE — H&P (Signed)
   NAME:  Kristin Cain, Kristin Cain                         ACCOUNT NO.:  [id number]   MEDICAL RECORD NO.:  [id number]                   PATIENT TYPE:  INP   LOCATION:  NA                                   FACILITY:  [facility]   PHYSICIAN:  Timothy P. Fontaine, M.D.           DATE OF BIRTH:  [birth year]   DATE OF ADMISSION:  01/20/2002  DATE OF DISCHARGE:                                HISTORY & PHYSICAL   CHIEF COMPLAINT:  Twin pregnancy 36-37 weeks vertex breech presentation.   HISTORY OF PRESENT ILLNESS:  A 40 year old G1, P0 female with twin gestation  at 36-[redacted] weeks gestation, mildly elevated blood pressures near term at  130s/90s.  Vertex transverse to breech presentation for primary cesarean  section.  For prenatal course see [facility].   PHYSICAL EXAMINATION:  HEENT:  Normal.  LUNGS:  Clear.  CARDIAC:  Regular rate without rubs, murmurs, or gallops.  ABDOMEN:  Gravid fundus, reactive NST x2.  PELVIC:  Deferred.   ASSESSMENT:  A 40 year old G1, P0 twin gestation near term.  Vertex breech  to transverse presentation for cesarean section.  Options for attempted  vaginal delivery with version of second twin versus outright section were  reviewed and the patient prefers section.  The risks were reviewed to  include bleeding, transfusion, infection, wound complications, open and  closure by secondary intention, inadvertent injury to internal organs  including bowel, bladder, ureters, vessels, and nerves necessitating major  explorative reparative surgeries, future reparative surgeries including  ostomy formation was all discussed, understood, and accepted.  The patient  wants to proceed with section.  Her questions were answered.                                               Timothy P. Audie Box, M.D.    TPF/MEDQ  D:  01/18/2002  T:  01/19/2002  Job:  [id number]

## 2010-09-10 ENCOUNTER — Other Ambulatory Visit: Payer: Self-pay | Admitting: Gynecology

## 2010-09-10 ENCOUNTER — Other Ambulatory Visit (HOSPITAL_COMMUNITY)
Admission: RE | Admit: 2010-09-10 | Discharge: 2010-09-10 | Disposition: A | Discharge status: Home / Self Care | Payer: BC Managed Care – PPO | Source: Ambulatory Visit | Attending: Gynecology | Admitting: Gynecology

## 2010-09-10 ENCOUNTER — Encounter (INDEPENDENT_AMBULATORY_CARE_PROVIDER_SITE_OTHER): Payer: BC Managed Care – PPO | Admitting: Gynecology

## 2010-09-10 DIAGNOSIS — Z01419 Encounter for gynecological examination (general) (routine) without abnormal findings: Secondary | ICD-10-CM

## 2010-09-10 DIAGNOSIS — Z833 Family history of diabetes mellitus: Secondary | ICD-10-CM

## 2010-09-10 DIAGNOSIS — R809 Proteinuria, unspecified: Secondary | ICD-10-CM

## 2010-09-10 DIAGNOSIS — B373 Candidiasis of vulva and vagina: Secondary | ICD-10-CM

## 2010-09-10 DIAGNOSIS — Z13220 Encounter for screening for lipoid disorders: Secondary | ICD-10-CM

## 2010-09-10 DIAGNOSIS — Z124 Encounter for screening for malignant neoplasm of cervix: Secondary | ICD-10-CM | POA: Insufficient documentation

## 2010-09-26 ENCOUNTER — Other Ambulatory Visit: Payer: Self-pay | Admitting: Gynecology

## 2010-09-26 DIAGNOSIS — Z1231 Encounter for screening mammogram for malignant neoplasm of breast: Secondary | ICD-10-CM

## 2010-10-25 ENCOUNTER — Ambulatory Visit: Payer: BC Managed Care – PPO

## 2010-10-30 ENCOUNTER — Ambulatory Visit
Admission: RE | Admit: 2010-10-30 | Discharge: 2010-10-30 | Disposition: A | Discharge status: Home / Self Care | Payer: BC Managed Care – PPO | Source: Ambulatory Visit | Attending: Gynecology | Admitting: Gynecology

## 2010-10-30 DIAGNOSIS — Z1231 Encounter for screening mammogram for malignant neoplasm of breast: Secondary | ICD-10-CM

## 2011-03-07 ENCOUNTER — Telehealth: Payer: Self-pay | Admitting: *Deleted

## 2011-03-07 MED ORDER — FLUCONAZOLE 150 MG PO TABS: 150.0000 mg | ORAL_TABLET | Freq: Once | ORAL | Status: AC

## 2011-03-07 NOTE — Telephone Encounter (Signed)
Pt called c/o yeast infection x 1week, itching, white discharge only. Pt states she just got back in town today and is going out of town tomorrow. I told pt that office visit would be best because her last OV was in June for her annual. Please advise.

## 2011-03-07 NOTE — Telephone Encounter (Signed)
Pt informed with the below note. 

## 2011-03-07 NOTE — Telephone Encounter (Signed)
Diflucan 150x1 dose

## 2011-05-01 ENCOUNTER — Ambulatory Visit: Payer: BC Managed Care – PPO | Admitting: Gynecology

## 2011-10-15 ENCOUNTER — Other Ambulatory Visit: Payer: Self-pay | Admitting: Gynecology

## 2011-10-15 DIAGNOSIS — Z1231 Encounter for screening mammogram for malignant neoplasm of breast: Secondary | ICD-10-CM

## 2011-10-16 ENCOUNTER — Encounter: Payer: Self-pay | Admitting: Gynecology

## 2011-10-16 ENCOUNTER — Ambulatory Visit (INDEPENDENT_AMBULATORY_CARE_PROVIDER_SITE_OTHER): Payer: BC Managed Care – PPO | Admitting: Gynecology

## 2011-10-16 VITALS — BP 108/60 | Ht 65.25 in | Wt 150.0 lb

## 2011-10-16 DIAGNOSIS — Z01419 Encounter for gynecological examination (general) (routine) without abnormal findings: Secondary | ICD-10-CM

## 2011-10-16 NOTE — Progress Notes (Signed)
RAYANA GEURIN 1971-02-21 [id number]        41 y.o.  for annual exam.  Doing well no complaints.  Past medical history,surgical history, medications, allergies, family history and social history were all reviewed and documented in the EPIC chart. ROS:  Was performed and pertinent positives and negatives are included in the history.  Exam: Sherrilyn Rist assistant Filed Vitals:   10/16/11 1026  BP: 108/60   General appearance  Normal Skin grossly normal Head/Neck normal with no cervical or supraclavicular adenopathy thyroid normal Lungs  clear Cardiac RR, without RMG Abdominal  soft, nontender, without masses, organomegaly or hernia Breasts  examined lying and sitting without masses, retractions, discharge or axillary adenopathy. Pelvic  Ext/BUS/vagina  normal    Adnexa  Without masses or tenderness    Anus and perineum  normal   Rectovaginal  normal sphincter tone without palpated masses or tenderness.    Assessment/Plan:  41 y.o. female for annual exam.    1. Status post TLH doing well no complaints. 2. Mammography. Patient has mammogram scheduled end of month. We'll continue with annual mammography. SBE monthly reviewed. 3. Pap smear. Last Pap smear 2012. No Pap smear done today. I reviewed current screening guidelines. She has numerous normal records in her chart, no history of abnormal Pap smears, she is status post hysterectomy for benign indication. Options to stop screening altogether reviewed. 4. Health maintenance. No blood work was done today as she has a normal lipid profile, glucose, urinalysis and CBC from 2012.    [name] MD, 11:18 AM 10/16/2011

## 2011-10-16 NOTE — Patient Instructions (Signed)
Follow up in one year for her annual gynecologic exam. 

## 2011-11-08 ENCOUNTER — Ambulatory Visit: Payer: BC Managed Care – PPO

## 2011-11-14 ENCOUNTER — Ambulatory Visit
Admission: RE | Admit: 2011-11-14 | Discharge: 2011-11-14 | Disposition: A | Discharge status: Home / Self Care | Payer: BC Managed Care – PPO | Source: Ambulatory Visit | Attending: Gynecology | Admitting: Gynecology

## 2011-11-14 DIAGNOSIS — Z1231 Encounter for screening mammogram for malignant neoplasm of breast: Secondary | ICD-10-CM

## 2012-01-29 ENCOUNTER — Encounter: Payer: Self-pay | Admitting: Gynecology

## 2012-01-29 ENCOUNTER — Ambulatory Visit (INDEPENDENT_AMBULATORY_CARE_PROVIDER_SITE_OTHER): Payer: BC Managed Care – PPO | Admitting: Gynecology

## 2012-01-29 DIAGNOSIS — N949 Unspecified condition associated with female genital organs and menstrual cycle: Secondary | ICD-10-CM

## 2012-01-29 DIAGNOSIS — B9689 Other specified bacterial agents as the cause of diseases classified elsewhere: Secondary | ICD-10-CM

## 2012-01-29 DIAGNOSIS — N9489 Other specified conditions associated with female genital organs and menstrual cycle: Secondary | ICD-10-CM

## 2012-01-29 DIAGNOSIS — N760 Acute vaginitis: Secondary | ICD-10-CM

## 2012-01-29 DIAGNOSIS — R102 Pelvic and perineal pain: Secondary | ICD-10-CM

## 2012-01-29 DIAGNOSIS — A499 Bacterial infection, unspecified: Secondary | ICD-10-CM

## 2012-01-29 LAB — URINALYSIS W MICROSCOPIC + REFLEX CULTURE
Bilirubin Urine: NEGATIVE
Glucose, UA: NEGATIVE mg/dL
Ketones, ur: NEGATIVE mg/dL
Specific Gravity, Urine: <1.005 — ABNORMAL LOW (ref 1.005–1.030)
Urobilinogen, UA: 0.2 mg/dL (ref 0.0–1.0)
pH: 6.5 (ref 5.0–8.0)

## 2012-01-29 MED ORDER — TINIDAZOLE 500 MG PO TABS: 2.0000 g | ORAL_TABLET | Freq: Once | ORAL | Status: DC

## 2012-01-29 NOTE — Progress Notes (Signed)
Patient presents with one-week history of burning with urination. Patient notes is actually in stream to when she's done. No pelvic pain frequency urgency low back pain fever chills.  Exam was Sherrilyn Rist Asst. Spine straight no CVA tenderness.  Abdomen soft nontender without masses guarding rebound organomegaly. Pelvic external BUS vagina with white discharge. Bimanual without masses or tenderness.  Assessment and plan: Wet prep exam consistent with BV.  Urinalysis is negative. Treat with Tindamax 2 g daily x2 days. All avoidance reviewed. Follow up if symptoms persist or recur.

## 2012-01-29 NOTE — Patient Instructions (Signed)
Take Tindamax 4 pills one day and 4 pills the next day. Avoid alcohol. Follow up if symptoms persist or recur.

## 2013-02-03 ENCOUNTER — Other Ambulatory Visit: Payer: Self-pay

## 2013-02-03 DIAGNOSIS — Z1231 Encounter for screening mammogram for malignant neoplasm of breast: Secondary | ICD-10-CM

## 2013-02-25 ENCOUNTER — Ambulatory Visit
Admission: RE | Admit: 2013-02-25 | Discharge: 2013-02-25 | Disposition: A | Discharge status: Home / Self Care | Payer: BC Managed Care – PPO | Source: Ambulatory Visit

## 2013-02-25 DIAGNOSIS — Z1231 Encounter for screening mammogram for malignant neoplasm of breast: Secondary | ICD-10-CM

## 2013-03-19 ENCOUNTER — Encounter: Payer: Self-pay | Admitting: Gynecology

## 2013-03-19 ENCOUNTER — Ambulatory Visit (INDEPENDENT_AMBULATORY_CARE_PROVIDER_SITE_OTHER): Payer: BC Managed Care – PPO | Admitting: Gynecology

## 2013-03-19 VITALS — BP 120/74 | Ht 64.0 in | Wt 147.0 lb

## 2013-03-19 DIAGNOSIS — Z01419 Encounter for gynecological examination (general) (routine) without abnormal findings: Secondary | ICD-10-CM

## 2013-03-19 DIAGNOSIS — L293 Anogenital pruritus, unspecified: Secondary | ICD-10-CM

## 2013-03-19 DIAGNOSIS — N898 Other specified noninflammatory disorders of vagina: Secondary | ICD-10-CM

## 2013-03-19 DIAGNOSIS — N949 Unspecified condition associated with female genital organs and menstrual cycle: Secondary | ICD-10-CM

## 2013-03-19 DIAGNOSIS — R102 Pelvic and perineal pain: Secondary | ICD-10-CM

## 2013-03-19 LAB — WET PREP FOR TRICH, YEAST, CLUE: Clue Cells Wet Prep HPF POC: NONE SEEN

## 2013-03-19 LAB — CBC WITH DIFFERENTIAL/PLATELET
Eosinophils Relative: 1 % (ref 0–5)
HCT: 41.3 % (ref 36.0–46.0)
Hemoglobin: 14.4 g/dL (ref 12.0–15.0)
Lymphocytes Relative: 32 % (ref 12–46)
Lymphs Abs: 2.4 10*3/uL (ref 0.7–4.0)
MCV: 82.6 fL (ref 78.0–100.0)
Monocytes Relative: 7 % (ref 3–12)
Platelets: 235 10*3/uL (ref 150–400)
RBC: 5 MIL/uL (ref 3.87–5.11)
WBC: 7.3 10*3/uL (ref 4.0–10.5)

## 2013-03-19 LAB — COMPREHENSIVE METABOLIC PANEL
ALT: 12 U/L (ref 0–35)
CO2: 22 mEq/L (ref 19–32)
Calcium: 9.1 mg/dL (ref 8.4–10.5)
Chloride: 104 mEq/L (ref 96–112)
Creat: 0.65 mg/dL (ref 0.50–1.10)
Glucose, Bld: 94 mg/dL (ref 70–99)
Sodium: 137 mEq/L (ref 135–145)
Total Protein: 6.6 g/dL (ref 6.0–8.3)

## 2013-03-19 LAB — LIPID PANEL
Cholesterol: 168 mg/dL (ref 0–200)
Triglycerides: 61 mg/dL (ref <150)

## 2013-03-19 MED ORDER — FLUCONAZOLE 150 MG PO TABS: 150.0000 mg | ORAL_TABLET | Freq: Once | ORAL | Status: DC

## 2013-03-19 NOTE — Patient Instructions (Signed)
Take Diflucan pill as needed for vaginal itching. Followup for ultrasound as scheduled. Followup in one year for annual exam.

## 2013-03-19 NOTE — Progress Notes (Signed)
Kristin Cain 22-Sep-1970 [id number]        42 y.o.  G2P2 for annual exam.  Several issues noted below.  Past medical history,surgical history, problem list, medications, allergies, family history and social history were all reviewed and documented in the EPIC chart.  ROS:  Performed and pertinent positives and negatives are included in the history, assessment and plan .  Exam: [name] assistant Filed Vitals:   03/19/13 1354  BP: 120/74  Height: 5\' 4"  (1.626 m)  Weight: 147 lb (66.679 kg)   General appearance  Normal Skin grossly normal Head/Neck normal with no cervical or supraclavicular adenopathy thyroid normal Lungs  clear Cardiac RR, without RMG Abdominal  soft, nontender, without masses, organomegaly or hernia Breasts  examined lying and sitting without masses, retractions, discharge or axillary adenopathy. Pelvic  Ext/BUS/vagina  Normal with slight white discharge  Adnexa  Without masses or tenderness    Anus and perineum  Normal   Rectovaginal  Normal sphincter tone without palpated masses or tenderness.    Assessment/Plan:  42 y.o. G2P2 female for annual exam.   1. Intermittent right lower quadrant pain. Patient notes fleeting right lower quadrant discomfort occurs every month to every other month. Seems to correlate previously with her menses. Status post TLH for menorrhagia. Will start with ultrasound rule out ovarian process and urinalysis. No other symptoms to include nausea, vomiting, diarrhea, constipation, dysuria, frequency, urgency. 2. Intermittent vaginal itching. Exam and wet prep consistent with yeast. Diflucan 150 mg x1, #2 given with one refill to use when necessary 3. Pap smear 2012. No Pap smear done today. History of LEEP a number of years ago with numerous normal Pap smears afterwards. Reviewed current screening guidelines. Options to stop screening altogether or less frequent screening intervals reviewed. We'll readdress on annual basis. 4. Mammography  02/2013. We'll continue with annual mammography. SBE monthly reviewed. Colonoscopy 2011. Repeated their recommended interval. 5. Health maintenance. Baseline CBC comprehensive metabolic panel lipid profile urinalysis ordered. Followup for ultrasound otherwise annually.   Note: This document was prepared with digital dictation and possible smart phrase technology. Any transcriptional errors that result from this process are unintentional.   [name] MD, 2:33 PM 03/19/2013

## 2013-03-20 LAB — URINALYSIS W MICROSCOPIC + REFLEX CULTURE
Bilirubin Urine: NEGATIVE
Casts: NONE SEEN
Crystals: NONE SEEN
Glucose, UA: NEGATIVE mg/dL
Specific Gravity, Urine: 1.026 (ref 1.005–1.030)
Squamous Epithelial / LPF: NONE SEEN

## 2013-04-12 ENCOUNTER — Other Ambulatory Visit: Payer: BC Managed Care – PPO

## 2013-04-12 ENCOUNTER — Ambulatory Visit: Payer: BC Managed Care – PPO | Admitting: Gynecology

## 2013-05-03 ENCOUNTER — Telehealth: Payer: Self-pay | Admitting: *Deleted

## 2013-05-03 NOTE — Telephone Encounter (Signed)
Pt was seen on 03/19/13 c/o Intermittent right lower quadrant pain, ultrasound order but pt canceled appointment, due to feeling somewhat better. Pt said now having right upper intermittent pain around right shoulder blade with right arm pain as well and left rib cage discomfort. Pt thought she pulled something during yoga, but discomfort has continued. Pt asked for recommendations? See PCP? Different type of ultrasound? Please advise

## 2013-05-03 NOTE — Telephone Encounter (Signed)
Does not sound gynecologic. Recommend primary M.D. followup and evaluation

## 2013-05-03 NOTE — Telephone Encounter (Signed)
Pt informed with the below note. 

## 2013-05-04 ENCOUNTER — Other Ambulatory Visit: Payer: Self-pay | Admitting: Internal Medicine

## 2013-05-04 ENCOUNTER — Ambulatory Visit
Admission: RE | Admit: 2013-05-04 | Discharge: 2013-05-04 | Disposition: A | Discharge status: Home / Self Care | Payer: BC Managed Care – PPO | Source: Ambulatory Visit | Attending: Internal Medicine | Admitting: Internal Medicine

## 2013-05-04 DIAGNOSIS — R0989 Other specified symptoms and signs involving the circulatory and respiratory systems: Secondary | ICD-10-CM

## 2013-05-04 DIAGNOSIS — R109 Unspecified abdominal pain: Secondary | ICD-10-CM

## 2013-09-21 ENCOUNTER — Ambulatory Visit (INDEPENDENT_AMBULATORY_CARE_PROVIDER_SITE_OTHER): Payer: BC Managed Care – PPO | Admitting: Women's Health

## 2013-09-21 ENCOUNTER — Encounter: Payer: Self-pay | Admitting: Women's Health

## 2013-09-21 DIAGNOSIS — B3731 Acute candidiasis of vulva and vagina: Secondary | ICD-10-CM

## 2013-09-21 DIAGNOSIS — R102 Pelvic and perineal pain: Secondary | ICD-10-CM

## 2013-09-21 DIAGNOSIS — B373 Candidiasis of vulva and vagina: Secondary | ICD-10-CM

## 2013-09-21 DIAGNOSIS — N9489 Other specified conditions associated with female genital organs and menstrual cycle: Secondary | ICD-10-CM

## 2013-09-21 DIAGNOSIS — M549 Dorsalgia, unspecified: Secondary | ICD-10-CM

## 2013-09-21 LAB — URINALYSIS W MICROSCOPIC + REFLEX CULTURE
Bilirubin Urine: NEGATIVE
Glucose, UA: NEGATIVE mg/dL
Hgb urine dipstick: NEGATIVE
Ketones, ur: NEGATIVE mg/dL
Leukocytes, UA: NEGATIVE
Nitrite: NEGATIVE
PH: 6 (ref 5.0–8.0)
PROTEIN: NEGATIVE mg/dL
Specific Gravity, Urine: 1.01 (ref 1.005–1.030)
Urobilinogen, UA: 0.2 mg/dL (ref 0.0–1.0)

## 2013-09-21 LAB — WET PREP FOR TRICH, YEAST, CLUE
Clue Cells Wet Prep HPF POC: NONE SEEN
Trich, Wet Prep: NONE SEEN
WBC WET PREP: NONE SEEN
Yeast Wet Prep HPF POC: NONE SEEN

## 2013-09-21 MED ORDER — TERCONAZOLE 0.4 % VA CREA: 1.0000 | TOPICAL_CREAM | Freq: Every day | VAGINAL | Status: DC

## 2013-09-21 NOTE — Progress Notes (Signed)
Patient ID: Kristin IlesDeborah M Goetsch, female   DOB: 10/14/1970, 43 y.o.   MRN: [id number]  Patient presents with complaint of burning after urinationand moderate white vaginal discharge with no odor.  x 3 days.  Denies fevers or chills, hematuria, urinary frequency or urgency. TAH for menorrhagia.  Exam: appears well External genitalia: mild erythema  Speculum: cervix absent. White discharge present, no odor.  UA: negative  Wet Prep: negative, moderate bacteria  Clinical Yeast vaginitis  Plan: Terazol 7 vaginal cream. Prescription, proper use discussed. Yeast prevention reviewed. Urine culture pending. Instructed to call if symptoms worsen or do not improve.

## 2014-01-21 ENCOUNTER — Other Ambulatory Visit: Payer: Self-pay

## 2014-02-07 ENCOUNTER — Encounter: Payer: Self-pay | Admitting: Women's Health

## 2014-02-10 ENCOUNTER — Other Ambulatory Visit: Payer: Self-pay

## 2014-02-10 DIAGNOSIS — Z1231 Encounter for screening mammogram for malignant neoplasm of breast: Secondary | ICD-10-CM

## 2014-03-01 ENCOUNTER — Ambulatory Visit
Admission: RE | Admit: 2014-03-01 | Discharge: 2014-03-01 | Disposition: A | Discharge status: Home / Self Care | Payer: BC Managed Care – PPO | Source: Ambulatory Visit

## 2014-03-01 DIAGNOSIS — Z1231 Encounter for screening mammogram for malignant neoplasm of breast: Secondary | ICD-10-CM

## 2014-04-18 ENCOUNTER — Encounter: Payer: BC Managed Care – PPO | Admitting: Gynecology

## 2014-06-02 ENCOUNTER — Ambulatory Visit (INDEPENDENT_AMBULATORY_CARE_PROVIDER_SITE_OTHER): Payer: BLUE CROSS/BLUE SHIELD | Admitting: Gynecology

## 2014-06-02 ENCOUNTER — Other Ambulatory Visit (HOSPITAL_COMMUNITY)
Admission: RE | Admit: 2014-06-02 | Discharge: 2014-06-02 | Disposition: A | Discharge status: Home / Self Care | Payer: Self-pay | Source: Ambulatory Visit | Attending: Gynecology | Admitting: Gynecology

## 2014-06-02 ENCOUNTER — Encounter: Payer: Self-pay | Admitting: Gynecology

## 2014-06-02 VITALS — BP 120/70 | Ht 65.0 in | Wt 145.0 lb

## 2014-06-02 DIAGNOSIS — Z01419 Encounter for gynecological examination (general) (routine) without abnormal findings: Secondary | ICD-10-CM | POA: Insufficient documentation

## 2014-06-02 LAB — CBC WITH DIFFERENTIAL/PLATELET
BASOS PCT: 1 % (ref 0–1)
Basophils Absolute: 0.1 10*3/uL (ref 0.0–0.1)
Eosinophils Absolute: 0.1 10*3/uL (ref 0.0–0.7)
Eosinophils Relative: 2 % (ref 0–5)
HCT: 42.8 % (ref 36.0–46.0)
Hemoglobin: 14.5 g/dL (ref 12.0–15.0)
Lymphocytes Relative: 37 % (ref 12–46)
Lymphs Abs: 2 10*3/uL (ref 0.7–4.0)
MCH: 29.2 pg (ref 26.0–34.0)
MCHC: 33.9 g/dL (ref 30.0–36.0)
MCV: 86.1 fL (ref 78.0–100.0)
MPV: 9.9 fL (ref 8.6–12.4)
Monocytes Absolute: 0.4 10*3/uL (ref 0.1–1.0)
Monocytes Relative: 7 % (ref 3–12)
Neutro Abs: 2.8 10*3/uL (ref 1.7–7.7)
Neutrophils Relative %: 53 % (ref 43–77)
PLATELETS: 255 10*3/uL (ref 150–400)
RBC: 4.97 MIL/uL (ref 3.87–5.11)
RDW: 13.8 % (ref 11.5–15.5)
WBC: 5.3 10*3/uL (ref 4.0–10.5)

## 2014-06-02 LAB — COMPREHENSIVE METABOLIC PANEL
ALT: 15 U/L (ref 0–35)
AST: 17 U/L (ref 0–37)
Albumin: 4.4 g/dL (ref 3.5–5.2)
Alkaline Phosphatase: 49 U/L (ref 39–117)
BUN: 11 mg/dL (ref 6–23)
CHLORIDE: 104 meq/L (ref 96–112)
CO2: 24 mEq/L (ref 19–32)
CREATININE: 0.67 mg/dL (ref 0.50–1.10)
Calcium: 9.1 mg/dL (ref 8.4–10.5)
Glucose, Bld: 98 mg/dL (ref 70–99)
Potassium: 4 mEq/L (ref 3.5–5.3)
Sodium: 138 mEq/L (ref 135–145)
Total Bilirubin: 1.2 mg/dL (ref 0.2–1.2)
Total Protein: 6.6 g/dL (ref 6.0–8.3)

## 2014-06-02 NOTE — Progress Notes (Signed)
Kristin IlesDeborah M Cain 12/22/1970 [id number]        44 y.o.  G2P2 for annual exam.  Doing well.  Past medical history,surgical history, problem list, medications, allergies, family history and social history were all reviewed and documented as reviewed in the EPIC chart.  ROS:  Performed with pertinent positives and negatives included in the history, assessment and plan.   Additional significant findings :  none   Exam: [name] personassistant Filed Vitals:   06/02/14 1402  BP: 120/70  Height: 5\' 5"  (1.651 m)  Weight: 145 lb (65.772 kg)   General appearance:  Normal affect, orientation and appearance. Skin: Grossly normal HEENT: Without gross lesions.  No cervical or supraclavicular adenopathy. Thyroid normal.  Lungs:  Clear without wheezing, rales or rhonchi Cardiac: RR, without RMG Abdominal:  Soft, nontender, without masses, guarding, rebound, organomegaly or hernia Breasts:  Examined lying and sitting without masses, retractions, discharge or axillary adenopathy. Pelvic:  Ext/BUS/vagina normal. Pap of cuff done  Adnexa  Without masses or tenderness    Anus and perineum  Normal   Rectovaginal  Normal sphincter tone without palpated masses or tenderness.    Assessment/Plan:  44 y.o. G2P2 female for annual exam, status post TLH 2011 for menorrhagia.   1. Pap smear 2012.  Pap smear of vaginal cuff today.  History of LEEP a number of years ago with numerous Pap smears in the interim. 2. Mammography 02/2014. Continue with annual mammography. SBE monthly reviewed. 3. Health maintenance. Baseline CBC, comprehensive metabolic panel and urinalysis done.  Lipid profile normal year ago. Follow up in one year, sooner as needed.     Dara LordsFONTAINE,Kristin Cain P MD, 2:30 PM 06/02/2014

## 2014-06-02 NOTE — Patient Instructions (Signed)
You may obtain a copy of any labs that were done today by logging onto MyChart as outlined in the instructions provided with your AVS (after visit summary). The office will not call with normal lab results but certainly if there are any significant abnormalities then we will contact you.   Health Maintenance, Female A healthy lifestyle and preventative care can promote health and wellness.  Maintain regular health, dental, and eye exams.  Eat a healthy diet. Foods like vegetables, fruits, whole grains, low-fat dairy products, and lean protein foods contain the nutrients you need without too many calories. Decrease your intake of foods high in solid fats, added sugars, and salt. Get information about a proper diet from your caregiver, if necessary.  Regular physical exercise is one of the most important things you can do for your health. Most adults should get at least 150 minutes of moderate-intensity exercise (any activity that increases your heart rate and causes you to sweat) each week. In addition, most adults need muscle-strengthening exercises on 2 or more days a week.   Maintain a healthy weight. The body mass index (BMI) is a screening tool to identify possible weight problems. It provides an estimate of body fat based on height and weight. Your caregiver can help determine your BMI, and can help you achieve or maintain a healthy weight. For adults 20 years and older:  A BMI below 18.5 is considered underweight.  A BMI of 18.5 to 24.9 is normal.  A BMI of 25 to 29.9 is considered overweight.  A BMI of 30 and above is considered obese.  Maintain normal blood lipids and cholesterol by exercising and minimizing your intake of saturated fat. Eat a balanced diet with plenty of fruits and vegetables. Blood tests for lipids and cholesterol should begin at age 61 and be repeated every 5 years. If your lipid or cholesterol levels are high, you are over 50, or you are a high risk for heart  disease, you may need your cholesterol levels checked more frequently.Ongoing high lipid and cholesterol levels should be treated with medicines if diet and exercise are not effective.  If you smoke, find out from your caregiver how to quit. If you do not use tobacco, do not start.  Lung cancer screening is recommended for adults aged 33 80 years who are at high risk for developing lung cancer because of a history of smoking. Yearly low-dose computed tomography (CT) is recommended for people who have at least a 30-pack-year history of smoking and are a current smoker or have quit within the past 15 years. A pack year of smoking is smoking an average of 1 pack of cigarettes a day for 1 year (for example: 1 pack a day for 30 years or 2 packs a day for 15 years). Yearly screening should continue until the smoker has stopped smoking for at least 15 years. Yearly screening should also be stopped for people who develop a health problem that would prevent them from having lung cancer treatment.  If you are pregnant, do not drink alcohol. If you are breastfeeding, be very cautious about drinking alcohol. If you are not pregnant and choose to drink alcohol, do not exceed 1 drink per day. One drink is considered to be 12 ounces (355 mL) of beer, 5 ounces (148 mL) of wine, or 1.5 ounces (44 mL) of liquor.  Avoid use of street drugs. Do not share needles with anyone. Ask for help if you need support or instructions about stopping  the use of drugs.  High blood pressure causes heart disease and increases the risk of stroke. Blood pressure should be checked at least every 1 to 2 years. Ongoing high blood pressure should be treated with medicines, if weight loss and exercise are not effective.  If you are 59 to 46 years old, ask your caregiver if you should take aspirin to prevent strokes.  Diabetes screening involves taking a blood sample to check your fasting blood sugar level. This should be done once every 3  years, after age 91, if you are within normal weight and without risk factors for diabetes. Testing should be considered at a younger age or be carried out more frequently if you are overweight and have at least 1 risk factor for diabetes.  Breast cancer screening is essential preventative care for women. You should practice "breast self-awareness." This means understanding the normal appearance and feel of your breasts and may include breast self-examination. Any changes detected, no matter how small, should be reported to a caregiver. Women in their 66s and 30s should have a clinical breast exam (CBE) by a caregiver as part of a regular health exam every 1 to 3 years. After age 101, women should have a CBE every year. Starting at age 100, women should consider having a mammogram (breast X-ray) every year. Women who have a family history of breast cancer should talk to their caregiver about genetic screening. Women at a high risk of breast cancer should talk to their caregiver about having an MRI and a mammogram every year.  Breast cancer gene (BRCA)-related cancer risk assessment is recommended for women who have family members with BRCA-related cancers. BRCA-related cancers include breast, ovarian, tubal, and peritoneal cancers. Having family members with these cancers may be associated with an increased risk for harmful changes (mutations) in the breast cancer genes BRCA1 and BRCA2. Results of the assessment will determine the need for genetic counseling and BRCA1 and BRCA2 testing.  The Pap test is a screening test for cervical cancer. Women should have a Pap test starting at age 57. Between ages 25 and 35, Pap tests should be repeated every 2 years. Beginning at age 37, you should have a Pap test every 3 years as long as the past 3 Pap tests have been normal. If you had a hysterectomy for a problem that was not cancer or a condition that could lead to cancer, then you no longer need Pap tests. If you are  between ages 50 and 76, and you have had normal Pap tests going back 10 years, you no longer need Pap tests. If you have had past treatment for cervical cancer or a condition that could lead to cancer, you need Pap tests and screening for cancer for at least 20 years after your treatment. If Pap tests have been discontinued, risk factors (such as a new sexual partner) need to be reassessed to determine if screening should be resumed. Some women have medical problems that increase the chance of getting cervical cancer. In these cases, your caregiver may recommend more frequent screening and Pap tests.  The human papillomavirus (HPV) test is an additional test that may be used for cervical cancer screening. The HPV test looks for the virus that can cause the cell changes on the cervix. The cells collected during the Pap test can be tested for HPV. The HPV test could be used to screen women aged 44 years and older, and should be used in women of any age  who have unclear Pap test results. After the age of 55, women should have HPV testing at the same frequency as a Pap test.  Colorectal cancer can be detected and often prevented. Most routine colorectal cancer screening begins at the age of 44 and continues through age 20. However, your caregiver may recommend screening at an earlier age if you have risk factors for colon cancer. On a yearly basis, your caregiver may provide home test kits to check for hidden blood in the stool. Use of a small camera at the end of a tube, to directly examine the colon (sigmoidoscopy or colonoscopy), can detect the earliest forms of colorectal cancer. Talk to your caregiver about this at age 86, when routine screening begins. Direct examination of the colon should be repeated every 5 to 10 years through age 13, unless early forms of pre-cancerous polyps or small growths are found.  Hepatitis C blood testing is recommended for all people born from [birth year] through 1967 and any  individual with known risks for hepatitis C.  Practice safe sex. Use condoms and avoid high-risk sexual practices to reduce the spread of sexually transmitted infections (STIs). Sexually active women aged 36 and younger should be checked for Chlamydia, which is a common sexually transmitted infection. Older women with new or multiple partners should also be tested for Chlamydia. Testing for other STIs is recommended if you are sexually active and at increased risk.  Osteoporosis is a disease in which the bones lose minerals and strength with aging. This can result in serious bone fractures. The risk of osteoporosis can be identified using a bone density scan. Women ages 20 and over and women at risk for fractures or osteoporosis should discuss screening with their caregivers. Ask your caregiver whether you should be taking a calcium supplement or vitamin D to reduce the rate of osteoporosis.  Menopause can be associated with physical symptoms and risks. Hormone replacement therapy is available to decrease symptoms and risks. You should talk to your caregiver about whether hormone replacement therapy is right for you.  Use sunscreen. Apply sunscreen liberally and repeatedly throughout the day. You should seek shade when your shadow is shorter than you. Protect yourself by wearing long sleeves, pants, a wide-brimmed hat, and sunglasses year round, whenever you are outdoors.  Notify your caregiver of new moles or changes in moles, especially if there is a change in shape or color. Also notify your caregiver if a mole is larger than the size of a pencil eraser.  Stay current with your immunizations. Document Released: 10/08/2010 Document Revised: 07/20/2012 Document Reviewed: 10/08/2010 [facility] Patient Information 2014 [employer].

## 2014-06-02 NOTE — Addendum Note (Signed)
Addended by: Dayna BarkerGARDNER, KIMBERLY K on: 06/02/2014 04:14 PM   Modules accepted: Orders

## 2014-06-03 LAB — URINALYSIS W MICROSCOPIC + REFLEX CULTURE
BACTERIA UA: NONE SEEN
Bilirubin Urine: NEGATIVE
CASTS: NONE SEEN
CRYSTALS: NONE SEEN
GLUCOSE, UA: NEGATIVE mg/dL
Hgb urine dipstick: NEGATIVE
Ketones, ur: NEGATIVE mg/dL
Leukocytes, UA: NEGATIVE
NITRITE: NEGATIVE
PH: 7 (ref 5.0–8.0)
Protein, ur: NEGATIVE mg/dL
SQUAMOUS EPITHELIAL / LPF: NONE SEEN
Specific Gravity, Urine: 1.008 (ref 1.005–1.030)
Urobilinogen, UA: 0.2 mg/dL (ref 0.0–1.0)

## 2014-06-07 LAB — CYTOLOGY - PAP

## 2014-12-08 ENCOUNTER — Encounter: Payer: Self-pay | Admitting: Gynecology

## 2014-12-08 ENCOUNTER — Ambulatory Visit (INDEPENDENT_AMBULATORY_CARE_PROVIDER_SITE_OTHER): Payer: BLUE CROSS/BLUE SHIELD | Admitting: Gynecology

## 2014-12-08 VITALS — BP 116/76

## 2014-12-08 DIAGNOSIS — N898 Other specified noninflammatory disorders of vagina: Secondary | ICD-10-CM

## 2014-12-08 DIAGNOSIS — R300 Dysuria: Secondary | ICD-10-CM

## 2014-12-08 LAB — URINALYSIS W MICROSCOPIC + REFLEX CULTURE
Bilirubin Urine: NEGATIVE
Casts: NONE SEEN [LPF]
Crystals: NONE SEEN [HPF]
Glucose, UA: NEGATIVE
Hgb urine dipstick: NEGATIVE
KETONES UR: NEGATIVE
Leukocytes, UA: NEGATIVE
Nitrite: NEGATIVE
Protein, ur: NEGATIVE
Specific Gravity, Urine: <1.005 (ref 1.001–1.035)
Yeast: NONE SEEN [HPF]
pH: 6.5 (ref 5.0–8.0)

## 2014-12-08 LAB — WET PREP FOR TRICH, YEAST, CLUE
Clue Cells Wet Prep HPF POC: NONE SEEN
Trich, Wet Prep: NONE SEEN

## 2014-12-08 MED ORDER — FLUCONAZOLE 150 MG PO TABS: 150.0000 mg | ORAL_TABLET | Freq: Once | ORAL | Status: DC

## 2014-12-08 NOTE — Patient Instructions (Signed)
Take the yeast pill daily for 4 days then once a week for 2 months. Call if symptoms persist or recur.

## 2014-12-08 NOTE — Progress Notes (Signed)
Kristin Cain [birth year] [id number]        44 y.o.  G2P2 Presents with several months of on and off vaginal itching with slight discharge. Also notes now when urinating feeling some irritation externally. No fever chills low back pain frequency urgency or pelvic pain.  No nausea vomiting diarrhea constipation. Tried OTC Monistat without relief.  Past medical history,surgical history, problem list, medications, allergies, family history and social history were all reviewed and documented in the EPIC chart.  Directed ROS with pertinent positives and negatives documented in the history of present illness/assessment and plan.  Exam: [name] assistant Filed Vitals:   12/08/14 0955  BP: 116/76   General appearance:  Normal Spine straight without CVA tenderness Abdomen soft nontender without masses guarding rebound Pelvic external BUS vagina with thick white discharge. Bimanual without masses or tenderness.  Assessment/Plan:  44 y.o. G2P2 with history and wet prep consistent with yeast vaginitis. Will treat with Diflucan 150 mg 4 days and then weekly 2 months to see if we can suppress her. Urinalysis shows few bacteria. Will follow up with culture and treat if positive. Patient will call if her symptoms persist or recur.    [name] MD, 10:20 AM 12/08/2014

## 2014-12-09 LAB — URINE CULTURE

## 2015-03-09 ENCOUNTER — Ambulatory Visit (INDEPENDENT_AMBULATORY_CARE_PROVIDER_SITE_OTHER): Payer: BLUE CROSS/BLUE SHIELD | Admitting: Gynecology

## 2015-03-09 ENCOUNTER — Encounter: Payer: Self-pay | Admitting: Gynecology

## 2015-03-09 VITALS — BP 118/80 | Ht 65.0 in | Wt 145.0 lb

## 2015-03-09 DIAGNOSIS — N760 Acute vaginitis: Secondary | ICD-10-CM | POA: Diagnosis not present

## 2015-03-09 LAB — WET PREP FOR TRICH, YEAST, CLUE
Clue Cells Wet Prep HPF POC: NONE SEEN
Trich, Wet Prep: NONE SEEN
WBC, Wet Prep HPF POC: NONE SEEN
YEAST WET PREP: NONE SEEN

## 2015-03-09 LAB — URINALYSIS W MICROSCOPIC + REFLEX CULTURE
Bilirubin Urine: NEGATIVE
Casts: NONE SEEN [LPF]
Crystals: NONE SEEN [HPF]
GLUCOSE, UA: NEGATIVE
LEUKOCYTES UA: NEGATIVE
NITRITE: NEGATIVE
PH: 5 (ref 5.0–8.0)
Protein, ur: NEGATIVE
SPECIFIC GRAVITY, URINE: 1.025 (ref 1.001–1.035)
WBC UA: NONE SEEN WBC/HPF (ref <=5)
YEAST: NONE SEEN [HPF]

## 2015-03-09 MED ORDER — FLUCONAZOLE 200 MG PO TABS: 200.0000 mg | ORAL_TABLET | Freq: Every day | ORAL | Status: DC

## 2015-03-09 MED ORDER — CLINDAMYCIN PHOSPHATE 2 % VA CREA: 1.0000 | TOPICAL_CREAM | Freq: Every day | VAGINAL | Status: DC

## 2015-03-09 NOTE — Addendum Note (Signed)
Addended by: Kem ParkinsonBARNES, Raeshaun Simson on: 03/09/2015 03:36 PM   Modules accepted: Orders

## 2015-03-09 NOTE — Patient Instructions (Signed)
Please see Cleocin vaginal cream nightly for 7 nights. Take the Diflucan pill daily for 5 days.  Follow up if symptoms persist, worsen or recur.

## 2015-03-09 NOTE — Progress Notes (Signed)
Kristin IlesDeborah M Cain 05/03/1970 [id number]        44 y.o.  G2P2 presents with persistent vaginal discharge burning and itching. Was treated for yeast vaginitis 12/08/2014 with Diflucan 4 days and then weekly Diflucan 2 months. Notes symptoms seem to have gotten better but not totally gone away. They now seem worse in the last 2 weeks. No frequency dysuria urgency fever chills  Past medical history,surgical history, problem list, medications, allergies, family history and social history were all reviewed and documented in the EPIC chart.  Directed ROS with pertinent positives and negatives documented in the history of present illness/assessment and plan.  Exam: Bari MantisKim Cain assistant Filed Vitals:   03/09/15 1357  BP: 118/80  Height: 5\' 5"  (1.651 m)  Weight: 145 lb (65.772 kg)   General appearance:  Normal Abdomen soft nontender without masses guarding rebound Pelvic external BS vagina with abundant white discharge. Bimanual without masses or tenderness  Assessment/Plan:  44 y.o. G2P2 with above history and exam. Wet prep did not show yeast. Urinalysis did show some back to your area. Will check culture. Will cover for bacterial vaginosis with Cleocin vaginal cream nightly 7 nights. We'll also treat for yeast with Diflucan 200 mg daily 5.  Follow up if symptoms persist, worsen or recur. Possibilities for boric acid suppositories suppression discussed.    Kristin Cain,Kristin Esterline P MD, 2:23 PM 03/09/2015

## 2015-03-11 LAB — URINE CULTURE
Colony Count: NO GROWTH
ORGANISM ID, BACTERIA: NO GROWTH

## 2015-04-11 ENCOUNTER — Telehealth: Payer: Self-pay | Admitting: *Deleted

## 2015-04-11 NOTE — Telephone Encounter (Signed)
Pt was seen on OV 03/09/15 given Rx for Diflucan 150 mg # 1 tablet and Cleocin vaginal cream nightly 7 nights. Pt said on 3rd night of cream she woke up with mid back discomfort on right side, she continued medication and completed both Rx. States discomfort still there, thought it could be muscular, she does yogo,not pain while doing yogo. I suggested PCP office visit, pt said she read on one of the medication it states to call MD if back pain should occur. Pt said she didn't call early because she thought symptoms would resolve. Pt would like you recommendations as well. Please advise

## 2015-04-11 NOTE — Telephone Encounter (Signed)
Pt informed with the below note. 

## 2015-04-11 NOTE — Telephone Encounter (Signed)
I do not think either is due to the medications.  If she is not having any UTI symptoms I would treat as musculoskeletal for now. Recommend heat and nonsteroidal anti-inflammatories. If it would continue I recommend following up with her primary physician.

## 2015-04-21 ENCOUNTER — Encounter: Payer: Self-pay | Admitting: Gynecology

## 2015-04-21 ENCOUNTER — Telehealth: Payer: Self-pay | Admitting: *Deleted

## 2015-04-21 ENCOUNTER — Ambulatory Visit (INDEPENDENT_AMBULATORY_CARE_PROVIDER_SITE_OTHER): Payer: BLUE CROSS/BLUE SHIELD | Admitting: Gynecology

## 2015-04-21 VITALS — BP 116/74

## 2015-04-21 DIAGNOSIS — N898 Other specified noninflammatory disorders of vagina: Secondary | ICD-10-CM

## 2015-04-21 DIAGNOSIS — L298 Other pruritus: Secondary | ICD-10-CM | POA: Diagnosis not present

## 2015-04-21 LAB — WET PREP FOR TRICH, YEAST, CLUE
Clue Cells Wet Prep HPF POC: NONE SEEN
Trich, Wet Prep: NONE SEEN
WBC WET PREP: NONE SEEN
Yeast Wet Prep HPF POC: NONE SEEN

## 2015-04-21 MED ORDER — FLUCONAZOLE 200 MG PO TABS
200.0000 mg | ORAL_TABLET | Freq: Every day | ORAL | Status: DC
Start: 2015-04-21 — End: 2015-06-07

## 2015-04-21 MED ORDER — NONFORMULARY OR COMPOUNDED ITEM: Status: DC

## 2015-04-21 NOTE — Telephone Encounter (Signed)
Rx called in 

## 2015-04-21 NOTE — Patient Instructions (Signed)
Takes the Diflucan pill daily for 3 days and then start the boric acid vaginal suppositories twice weekly over the next several months. Follow up at your annual exam as scheduled

## 2015-04-21 NOTE — Progress Notes (Signed)
Kristin IlesDeborah M Cain 06/23/1970 [id number]        45 y.o.  G2P2 Presents complaining of vaginal itching over the last 2 days.  Was treated for yeast in September with Diflucan with resolution of her symptoms. Subsequently treated 03/09/2015 with Diflucan and Cleocin vaginal cream for vaginal irritation and discharge with resolution of her symptoms. Now with vaginal itching and irritation of the last 2 days. No heavy discharge or odor. No urinary symptoms such as frequency dysuria or urgency.  Past medical history,surgical history, problem list, medications, allergies, family history and social history were all reviewed and documented in the EPIC chart.  Directed ROS with pertinent positives and negatives documented in the history of present illness/assessment and plan.  Exam: Kristin PortelaKim Cain assistant Filed Vitals:   04/21/15 0925  BP: 116/74   General appearance:  Normal Abdomen soft nontender without masses guarding rebound Pelvic external BUS vagina with white discharge. Bimanual without masses or tenderness  Assessment/Plan:  45 y.o. G2P2 with history and exam as above. Wet prep is unremarkable. We'll cover for yeast with Diflucan 200 mg daily 3 days. Discussed suppressive therapy with boric acid suppositories 600 mg twice weekly. Patient wants to go ahead and try this. #30 prescribed. Follow up if symptoms persist, worsen or recur. Patient has her annual exam scheduled in 2 months and will follow up for this, sooner if any issues.    Kristin LordsFONTAINE,Eryc Bodey P MD, 9:55 AM 04/21/2015

## 2015-04-21 NOTE — Telephone Encounter (Signed)
-----   Message from Dara Lordsimothy P Fontaine, MD sent at 04/21/2015  9:53 AM EST ----- Call in to [facility] boric acid suppositories 600 mg #30 place and vagina twice weekly

## 2015-04-21 NOTE — Addendum Note (Signed)
Addended by: Dayna BarkerGARDNER, Maydell Knoebel K on: 04/21/2015 10:28 AM   Modules accepted: Orders

## 2015-04-25 ENCOUNTER — Other Ambulatory Visit: Payer: Self-pay

## 2015-04-25 DIAGNOSIS — Z1231 Encounter for screening mammogram for malignant neoplasm of breast: Secondary | ICD-10-CM

## 2015-05-12 ENCOUNTER — Ambulatory Visit
Admission: RE | Admit: 2015-05-12 | Discharge: 2015-05-12 | Disposition: A | Discharge status: Home / Self Care | Payer: BLUE CROSS/BLUE SHIELD | Source: Ambulatory Visit

## 2015-05-12 DIAGNOSIS — Z1231 Encounter for screening mammogram for malignant neoplasm of breast: Secondary | ICD-10-CM

## 2015-06-07 ENCOUNTER — Ambulatory Visit (INDEPENDENT_AMBULATORY_CARE_PROVIDER_SITE_OTHER): Payer: BLUE CROSS/BLUE SHIELD | Admitting: Gynecology

## 2015-06-07 ENCOUNTER — Encounter: Payer: Self-pay | Admitting: Gynecology

## 2015-06-07 VITALS — BP 112/70 | Ht 65.0 in | Wt 147.0 lb

## 2015-06-07 DIAGNOSIS — Z13220 Encounter for screening for lipoid disorders: Secondary | ICD-10-CM | POA: Diagnosis not present

## 2015-06-07 DIAGNOSIS — Z01419 Encounter for gynecological examination (general) (routine) without abnormal findings: Secondary | ICD-10-CM

## 2015-06-07 DIAGNOSIS — N760 Acute vaginitis: Secondary | ICD-10-CM | POA: Diagnosis not present

## 2015-06-07 LAB — CBC WITH DIFFERENTIAL/PLATELET
BASOS ABS: 0 10*3/uL (ref 0.0–0.1)
Basophils Relative: 1 % (ref 0–1)
Eosinophils Absolute: 0.1 10*3/uL (ref 0.0–0.7)
Eosinophils Relative: 2 % (ref 0–5)
HEMATOCRIT: 43.1 % (ref 36.0–46.0)
HEMOGLOBIN: 14.3 g/dL (ref 12.0–15.0)
LYMPHS ABS: 1.7 10*3/uL (ref 0.7–4.0)
LYMPHS PCT: 36 % (ref 12–46)
MCH: 29.1 pg (ref 26.0–34.0)
MCHC: 33.2 g/dL (ref 30.0–36.0)
MCV: 87.8 fL (ref 78.0–100.0)
MPV: 10.8 fL (ref 8.6–12.4)
Monocytes Absolute: 0.3 10*3/uL (ref 0.1–1.0)
Monocytes Relative: 6 % (ref 3–12)
NEUTROS ABS: 2.6 10*3/uL (ref 1.7–7.7)
NEUTROS PCT: 55 % (ref 43–77)
PLATELETS: 235 10*3/uL (ref 150–400)
RBC: 4.91 MIL/uL (ref 3.87–5.11)
RDW: 13.6 % (ref 11.5–15.5)
WBC: 4.7 10*3/uL (ref 4.0–10.5)

## 2015-06-07 LAB — COMPREHENSIVE METABOLIC PANEL
ALBUMIN: 4.2 g/dL (ref 3.6–5.1)
ALK PHOS: 52 U/L (ref 33–115)
ALT: 15 U/L (ref 6–29)
AST: 19 U/L (ref 10–35)
BUN: 14 mg/dL (ref 7–25)
CALCIUM: 8.7 mg/dL (ref 8.6–10.2)
CO2: 24 mmol/L (ref 20–31)
Chloride: 105 mmol/L (ref 98–110)
Creat: 0.67 mg/dL (ref 0.50–1.10)
Glucose, Bld: 88 mg/dL (ref 65–99)
POTASSIUM: 4.3 mmol/L (ref 3.5–5.3)
Sodium: 138 mmol/L (ref 135–146)
TOTAL PROTEIN: 6.7 g/dL (ref 6.1–8.1)
Total Bilirubin: 1.1 mg/dL (ref 0.2–1.2)

## 2015-06-07 LAB — WET PREP FOR TRICH, YEAST, CLUE
Clue Cells Wet Prep HPF POC: NONE SEEN
Trich, Wet Prep: NONE SEEN
WBC, Wet Prep HPF POC: NONE SEEN
YEAST WET PREP: NONE SEEN

## 2015-06-07 LAB — LIPID PANEL
CHOL/HDL RATIO: 2.5 ratio (ref <=5.0)
CHOLESTEROL: 166 mg/dL (ref 125–200)
HDL: 67 mg/dL (ref >=46)
LDL CALC: 87 mg/dL (ref <130)
Triglycerides: 62 mg/dL (ref <150)
VLDL: 12 mg/dL (ref <30)

## 2015-06-07 MED ORDER — TERCONAZOLE 0.4 % VA CREA: 1.0000 | TOPICAL_CREAM | Freq: Every day | VAGINAL | Status: DC

## 2015-06-07 NOTE — Patient Instructions (Signed)
Use the Terazol vaginal cream nightly for 7 nights. Call me at the end of this to let me know how you're doing.  You may obtain a copy of any labs that were done today by logging onto MyChart as outlined in the instructions provided with your AVS (after visit summary). The office will not call with normal lab results but certainly if there are any significant abnormalities then we will contact you.   Health Maintenance Adopting a healthy lifestyle and getting preventive care can go a long way to promote health and wellness. Talk with your health care provider about what schedule of regular examinations is right for you. This is a good chance for you to check in with your provider about disease prevention and staying healthy. In between checkups, there are plenty of things you can do on your own. Experts have done a lot of research about which lifestyle changes and preventive measures are most likely to keep you healthy. Ask your health care provider for more information. WEIGHT AND DIET  Eat a healthy diet  Be sure to include plenty of vegetables, fruits, low-fat dairy products, and lean protein.  Do not eat a lot of foods high in solid fats, added sugars, or salt.  Get regular exercise. This is one of the most important things you can do for your health.  Most adults should exercise for at least 150 minutes each week. The exercise should increase your heart rate and make you sweat (moderate-intensity exercise).  Most adults should also do strengthening exercises at least twice a week. This is in addition to the moderate-intensity exercise.  Maintain a healthy weight  Body mass index (BMI) is a measurement that can be used to identify possible weight problems. It estimates body fat based on height and weight. Your health care provider can help determine your BMI and help you achieve or maintain a healthy weight.  For females 28 years of age and older:   A BMI below 18.5 is considered  underweight.  A BMI of 18.5 to 24.9 is normal.  A BMI of 25 to 29.9 is considered overweight.  A BMI of 30 and above is considered obese.  Watch levels of cholesterol and blood lipids  You should start having your blood tested for lipids and cholesterol at 47 years of age, then have this test every 5 years.  You may need to have your cholesterol levels checked more often if:  Your lipid or cholesterol levels are high.  You are older than 47 years of age.  You are at high risk for heart disease.  CANCER SCREENING   Lung Cancer  Lung cancer screening is recommended for adults 44-15 years old who are at high risk for lung cancer because of a history of smoking.  A yearly low-dose CT scan of the lungs is recommended for people who:  Currently smoke.  Have quit within the past 15 years.  Have at least a 30-pack-year history of smoking. A pack year is smoking an average of one pack of cigarettes a day for 1 year.  Yearly screening should continue until it has been 15 years since you quit.  Yearly screening should stop if you develop a health problem that would prevent you from having lung cancer treatment.  Breast Cancer  Practice breast self-awareness. This means understanding how your breasts normally appear and feel.  It also means doing regular breast self-exams. Let your health care provider know about any changes, no matter how small.  If you are in your 20s or 30s, you should have a clinical breast exam (CBE) by a health care provider every 1-3 years as part of a regular health exam.  If you are 44 or older, have a CBE every year. Also consider having a breast X-ray (mammogram) every year.  If you have a family history of breast cancer, talk to your health care provider about genetic screening.  If you are at high risk for breast cancer, talk to your health care provider about having an MRI and a mammogram every year.  Breast cancer gene (BRCA) assessment is  recommended for women who have family members with BRCA-related cancers. BRCA-related cancers include:  Breast.  Ovarian.  Tubal.  Peritoneal cancers.  Results of the assessment will determine the need for genetic counseling and BRCA1 and BRCA2 testing. Cervical Cancer Routine pelvic examinations to screen for cervical cancer are no longer recommended for nonpregnant women who are considered low risk for cancer of the pelvic organs (ovaries, uterus, and vagina) and who do not have symptoms. A pelvic examination may be necessary if you have symptoms including those associated with pelvic infections. Ask your health care provider if a screening pelvic exam is right for you.   The Pap test is the screening test for cervical cancer for women who are considered at risk.  If you had a hysterectomy for a problem that was not cancer or a condition that could lead to cancer, then you no longer need Pap tests.  If you are older than 65 years, and you have had normal Pap tests for the past 10 years, you no longer need to have Pap tests.  If you have had past treatment for cervical cancer or a condition that could lead to cancer, you need Pap tests and screening for cancer for at least 20 years after your treatment.  If you no longer get a Pap test, assess your risk factors if they change (such as having a new sexual partner). This can affect whether you should start being screened again.  Some women have medical problems that increase their chance of getting cervical cancer. If this is the case for you, your health care provider may recommend more frequent screening and Pap tests.  The human papillomavirus (HPV) test is another test that may be used for cervical cancer screening. The HPV test looks for the virus that can cause cell changes in the cervix. The cells collected during the Pap test can be tested for HPV.  The HPV test can be used to screen women 64 years of age and older. Getting tested  for HPV can extend the interval between normal Pap tests from three to five years.  An HPV test also should be used to screen women of any age who have unclear Pap test results.  After 47 years of age, women should have HPV testing as often as Pap tests.  Colorectal Cancer  This type of cancer can be detected and often prevented.  Routine colorectal cancer screening usually begins at 47 years of age and continues through 47 years of age.  Your health care provider may recommend screening at an earlier age if you have risk factors for colon cancer.  Your health care provider may also recommend using home test kits to check for hidden blood in the stool.  A small camera at the end of a tube can be used to examine your colon directly (sigmoidoscopy or colonoscopy). This is done to check for  the earliest forms of colorectal cancer.  Routine screening usually begins at age 68.  Direct examination of the colon should be repeated every 5-10 years through 47 years of age. However, you may need to be screened more often if early forms of precancerous polyps or small growths are found. Skin Cancer  Check your skin from head to toe regularly.  Tell your health care provider about any new moles or changes in moles, especially if there is a change in a mole's shape or color.  Also tell your health care provider if you have a mole that is larger than the size of a pencil eraser.  Always use sunscreen. Apply sunscreen liberally and repeatedly throughout the day.  Protect yourself by wearing long sleeves, pants, a wide-brimmed hat, and sunglasses whenever you are outside. HEART DISEASE, DIABETES, AND HIGH BLOOD PRESSURE   Have your blood pressure checked at least every 1-2 years. High blood pressure causes heart disease and increases the risk of stroke.  If you are between 52 years and 26 years old, ask your health care provider if you should take aspirin to prevent strokes.  Have regular  diabetes screenings. This involves taking a blood sample to check your fasting blood sugar level.  If you are at a normal weight and have a low risk for diabetes, have this test once every three years after 47 years of age.  If you are overweight and have a high risk for diabetes, consider being tested at a younger age or more often. PREVENTING INFECTION  Hepatitis B  If you have a higher risk for hepatitis B, you should be screened for this virus. You are considered at high risk for hepatitis B if:  You were born in a country where hepatitis B is common. Ask your health care provider which countries are considered high risk.  Your parents were born in a high-risk country, and you have not been immunized against hepatitis B (hepatitis B vaccine).  You have HIV or AIDS.  You use needles to inject street drugs.  You live with someone who has hepatitis B.  You have had sex with someone who has hepatitis B.  You get hemodialysis treatment.  You take certain medicines for conditions, including cancer, organ transplantation, and autoimmune conditions. Hepatitis C  Blood testing is recommended for:  Everyone born from [birth year] through 1967.  Anyone with known risk factors for hepatitis C. Sexually transmitted infections (STIs)  You should be screened for sexually transmitted infections (STIs) including gonorrhea and chlamydia if:  You are sexually active and are younger than 47 years of age.  You are older than 47 years of age and your health care provider tells you that you are at risk for this type of infection.  Your sexual activity has changed since you were last screened and you are at an increased risk for chlamydia or gonorrhea. Ask your health care provider if you are at risk.  If you do not have HIV, but are at risk, it may be recommended that you take a prescription medicine daily to prevent HIV infection. This is called pre-exposure prophylaxis (PrEP). You are considered at  risk if:  You are sexually active and do not regularly use condoms or know the HIV status of your partner(s).  You take drugs by injection.  You are sexually active with a partner who has HIV. Talk with your health care provider about whether you are at high risk of being infected with HIV. If you  choose to begin PrEP, you should first be tested for HIV. You should then be tested every 3 months for as long as you are taking PrEP.  PREGNANCY   If you are premenopausal and you may become pregnant, ask your health care provider about preconception counseling.  If you may become pregnant, take 400 to 800 micrograms (mcg) of folic acid every day.  If you want to prevent pregnancy, talk to your health care provider about birth control (contraception). OSTEOPOROSIS AND MENOPAUSE   Osteoporosis is a disease in which the bones lose minerals and strength with aging. This can result in serious bone fractures. Your risk for osteoporosis can be identified using a bone density scan.  If you are 9 years of age or older, or if you are at risk for osteoporosis and fractures, ask your health care provider if you should be screened.  Ask your health care provider whether you should take a calcium or vitamin D supplement to lower your risk for osteoporosis.  Menopause may have certain physical symptoms and risks.  Hormone replacement therapy may reduce some of these symptoms and risks. Talk to your health care provider about whether hormone replacement therapy is right for you.  HOME CARE INSTRUCTIONS   Schedule regular health, dental, and eye exams.  Stay current with your immunizations.   Do not use any tobacco products including cigarettes, chewing tobacco, or electronic cigarettes.  If you are pregnant, do not drink alcohol.  If you are breastfeeding, limit how much and how often you drink alcohol.  Limit alcohol intake to no more than 1 drink per day for nonpregnant women. One drink equals  12 ounces of beer, 5 ounces of wine, or 1 ounces of hard liquor.  Do not use street drugs.  Do not share needles.  Ask your health care provider for help if you need support or information about quitting drugs.  Tell your health care provider if you often feel depressed.  Tell your health care provider if you have ever been abused or do not feel safe at home. Document Released: 10/08/2010 Document Revised: 08/09/2013 Document Reviewed: 02/24/2013 [facility] Patient Information 2015 [place], [place]. This information is not intended to replace advice given to you by your health care provider. Make sure you discuss any questions you have with your health care provider.

## 2015-06-07 NOTE — Progress Notes (Signed)
Kristin Cain June 25, 1970 [id number]        45 y.o.  G2P2  for annual exam.  Several issues noted below.  Past medical history,surgical history, problem list, medications, allergies, family history and social history were all reviewed and documented as reviewed in the EPIC chart.  ROS:  Performed with pertinent positives and negatives included in the history, assessment and plan.   Additional significant findings :  none   Exam: [name] assistant Filed Vitals:   06/07/15 0937  BP: 112/70  Height:  (1.651 m)  Weight: 147 lb (66.679 kg)   General appearance:  Normal affect, orientation and appearance. Skin: Grossly normal HEENT: Without gross lesions.  No cervical or supraclavicular adenopathy. Thyroid normal.  Lungs:  Clear without wheezing, rales or rhonchi Cardiac: RR, without RMG Abdominal:  Soft, nontender, without masses, guarding, rebound, organomegaly or hernia Breasts:  Examined lying and sitting without masses, retractions, discharge or axillary adenopathy. Pelvic:  Ext/BUS/vagina with scant discharge  Adnexa without masses or tenderness    Anus and perineum normal   Rectovaginal normal sphincter tone without palpated masses or tenderness.    Assessment/Plan:  45 y.o. G2P2 female for annual exam.   1. Recurrent vaginitis. Patient was treated recently 04/21/2015 for yeast vaginitis with Diflucan 3 days and work acid suppositories twice weekly. After several weeks she stopped them due to vaginal irritation. She notes though that the itching has returned. Exam shows scant discharge. Wet prep unremarkable. She does note that the itching previously resolved with the Diflucan. Will cover for a resistant yeast with Terazol 7 day cream. She will call me regardless at the end of the 7 days to let me know how she is doing. If symptoms clear discussed weekly Terazole application as a suppression for 2 months and see how she does.  No symptoms to suggest menopause but will  check baseline FSH to make sure atrophic issues are playing into her recurrent vaginitis. 2. Mammography 05/2015. Continue with annual mammography when due. SBE monthly reviewed.  3. Pap smear 05/2014. No Pap smear done today.  History of LEEP number of years ago with normal Pap smears since then.  Will plan repeat Pap smear in 3 year interval. 4. Coloscopy 2011. Repeat at their recommended interval. 5. Health maintenance. Baseline CBC comprehensive metabolic panel lipid profile urinalysis along with her [facility] ordered.  Patient will call me in a week to see how she is doing otherwise follow up in one year or sooner as needed.   [name] MD, 10:22 AM 06/07/2015

## 2015-06-07 NOTE — Addendum Note (Signed)
Addended by: Dayna Barker on: 06/07/2015 10:49 AM   Modules accepted: Orders

## 2015-06-08 ENCOUNTER — Telehealth: Payer: Self-pay

## 2015-06-08 ENCOUNTER — Other Ambulatory Visit: Payer: Self-pay | Admitting: Gynecology

## 2015-06-08 DIAGNOSIS — E349 Endocrine disorder, unspecified: Secondary | ICD-10-CM

## 2015-06-08 DIAGNOSIS — R7989 Other specified abnormal findings of blood chemistry: Secondary | ICD-10-CM

## 2015-06-08 LAB — URINALYSIS W MICROSCOPIC + REFLEX CULTURE
Bilirubin Urine: NEGATIVE
CASTS: NONE SEEN [LPF]
Crystals: NONE SEEN [HPF]
Glucose, UA: NEGATIVE
HGB URINE DIPSTICK: NEGATIVE
KETONES UR: NEGATIVE
Leukocytes, UA: NEGATIVE
Nitrite: NEGATIVE
PROTEIN: NEGATIVE
Specific Gravity, Urine: 1.021 (ref 1.001–1.035)
WBC, UA: NONE SEEN WBC/HPF (ref <=5)
YEAST: NONE SEEN [HPF]
pH: 8 (ref 5.0–8.0)

## 2015-06-08 LAB — FOLLICLE STIMULATING HORMONE: FSH: 25 m[IU]/mL

## 2015-06-08 NOTE — Telephone Encounter (Signed)
Patient informed. 

## 2015-06-08 NOTE — Telephone Encounter (Signed)
Usually takes several days to take effect.

## 2015-06-08 NOTE — Telephone Encounter (Signed)
Patient saw you yesterday and you wanted her to try Terconazole Cream.  She said she used it at bedtime last night and has had no relief. She asked if she should have had some relief from it?

## 2015-06-09 ENCOUNTER — Other Ambulatory Visit: Payer: Self-pay | Admitting: Gynecology

## 2015-06-09 MED ORDER — SULFAMETHOXAZOLE-TRIMETHOPRIM 800-160 MG PO TABS: 1.0000 | ORAL_TABLET | Freq: Two times a day (BID) | ORAL | Status: DC

## 2015-06-10 LAB — URINE CULTURE: Colony Count: >=100000

## 2015-06-12 ENCOUNTER — Other Ambulatory Visit: Payer: Self-pay | Admitting: Gynecology

## 2015-06-12 MED ORDER — CIPROFLOXACIN HCL 250 MG PO TABS: 250.0000 mg | ORAL_TABLET | Freq: Two times a day (BID) | ORAL | Status: DC

## 2015-06-16 ENCOUNTER — Other Ambulatory Visit: Payer: BLUE CROSS/BLUE SHIELD

## 2015-06-16 ENCOUNTER — Telehealth: Payer: Self-pay | Admitting: *Deleted

## 2015-06-16 DIAGNOSIS — N390 Urinary tract infection, site not specified: Secondary | ICD-10-CM

## 2015-06-16 NOTE — Telephone Encounter (Signed)
Pt coming today for u/a,order placed , will be here at 10:45am

## 2015-06-16 NOTE — Telephone Encounter (Signed)
(  Dr.Fontaine patient) pt called to follow up from office visit 06/07/15, pt completed Rx Terazol 7 day cream took last dose yesterday am, noticed burning after emptying her bladder and some lower right side discomfort. Please advise

## 2015-06-16 NOTE — Telephone Encounter (Signed)
Best to check a clean catch urine or she could try over-the-counter Azo today

## 2015-06-17 LAB — URINALYSIS W MICROSCOPIC + REFLEX CULTURE
BILIRUBIN URINE: NEGATIVE
Bacteria, UA: NONE SEEN [HPF]
Casts: NONE SEEN [LPF]
Crystals: NONE SEEN [HPF]
GLUCOSE, UA: NEGATIVE
Hgb urine dipstick: NEGATIVE
Ketones, ur: NEGATIVE
LEUKOCYTES UA: NEGATIVE
NITRITE: NEGATIVE
PROTEIN: NEGATIVE
RBC / HPF: NONE SEEN RBC/HPF (ref <=2)
SPECIFIC GRAVITY, URINE: 1.014 (ref 1.001–1.035)
Squamous Epithelial / LPF: NONE SEEN [HPF] (ref <=5)
WBC UA: NONE SEEN WBC/HPF (ref <=5)
YEAST: NONE SEEN [HPF]
pH: 8 (ref 5.0–8.0)

## 2015-06-18 ENCOUNTER — Encounter: Payer: Self-pay | Admitting: Women's Health

## 2015-06-21 ENCOUNTER — Telehealth: Payer: Self-pay | Admitting: *Deleted

## 2015-06-21 MED ORDER — TRIAMCINOLONE ACETONIDE 0.1 % EX CREA: 1.0000 "application " | TOPICAL_CREAM | Freq: Two times a day (BID) | CUTANEOUS | Status: DC

## 2015-06-21 MED ORDER — NYSTATIN 100000 UNIT/GM EX CREA: 1.0000 "application " | TOPICAL_CREAM | Freq: Two times a day (BID) | CUTANEOUS | Status: DC

## 2015-06-21 NOTE — Telephone Encounter (Signed)
Pt came and had repeat urine on 06/16/15 results normal, treated with cipro 250 mg on 06/12/15 and Terazol cream on 06/07/15. Pt said she still has slight vaginal irritation mild itching external. She asked if this vaginal irritation continues what should she do? Please advise

## 2015-06-21 NOTE — Telephone Encounter (Signed)
If it seems to be mostly external that I would try Mytrex or Mycolog twice daily externally. This has both antifungal and steroid properties.

## 2015-06-21 NOTE — Telephone Encounter (Signed)
Left on voicemail Rx sent. And the below

## 2015-09-08 ENCOUNTER — Other Ambulatory Visit: Payer: Self-pay

## 2016-03-04 DIAGNOSIS — N951 Menopausal and female climacteric states: Secondary | ICD-10-CM | POA: Diagnosis not present

## 2016-03-04 DIAGNOSIS — K589 Irritable bowel syndrome without diarrhea: Secondary | ICD-10-CM | POA: Diagnosis not present

## 2016-03-04 DIAGNOSIS — B373 Candidiasis of vulva and vagina: Secondary | ICD-10-CM | POA: Diagnosis not present

## 2016-03-04 DIAGNOSIS — R5383 Other fatigue: Secondary | ICD-10-CM | POA: Diagnosis not present

## 2016-04-03 DIAGNOSIS — B373 Candidiasis of vulva and vagina: Secondary | ICD-10-CM | POA: Diagnosis not present

## 2016-04-03 DIAGNOSIS — N951 Menopausal and female climacteric states: Secondary | ICD-10-CM | POA: Diagnosis not present

## 2016-04-03 DIAGNOSIS — R5383 Other fatigue: Secondary | ICD-10-CM | POA: Diagnosis not present

## 2016-04-03 DIAGNOSIS — K589 Irritable bowel syndrome without diarrhea: Secondary | ICD-10-CM | POA: Diagnosis not present

## 2016-05-23 DIAGNOSIS — D2262 Melanocytic nevi of left upper limb, including shoulder: Secondary | ICD-10-CM | POA: Diagnosis not present

## 2016-05-23 DIAGNOSIS — D225 Melanocytic nevi of trunk: Secondary | ICD-10-CM | POA: Diagnosis not present

## 2016-05-23 DIAGNOSIS — D2271 Melanocytic nevi of right lower limb, including hip: Secondary | ICD-10-CM | POA: Diagnosis not present

## 2016-05-23 DIAGNOSIS — D2272 Melanocytic nevi of left lower limb, including hip: Secondary | ICD-10-CM | POA: Diagnosis not present

## 2016-06-07 ENCOUNTER — Encounter: Payer: BLUE CROSS/BLUE SHIELD | Admitting: Gynecology

## 2016-07-01 DIAGNOSIS — J069 Acute upper respiratory infection, unspecified: Secondary | ICD-10-CM | POA: Diagnosis not present

## 2016-07-01 DIAGNOSIS — E559 Vitamin D deficiency, unspecified: Secondary | ICD-10-CM | POA: Diagnosis not present

## 2016-07-01 DIAGNOSIS — R5383 Other fatigue: Secondary | ICD-10-CM | POA: Diagnosis not present

## 2016-07-01 DIAGNOSIS — N951 Menopausal and female climacteric states: Secondary | ICD-10-CM | POA: Diagnosis not present

## 2016-07-09 ENCOUNTER — Other Ambulatory Visit: Payer: Self-pay | Admitting: Gynecology

## 2016-07-09 DIAGNOSIS — Z1231 Encounter for screening mammogram for malignant neoplasm of breast: Secondary | ICD-10-CM

## 2016-07-29 ENCOUNTER — Ambulatory Visit
Admission: RE | Admit: 2016-07-29 | Discharge: 2016-07-29 | Disposition: A | Discharge status: Home / Self Care | Payer: BLUE CROSS/BLUE SHIELD | Source: Ambulatory Visit | Attending: Gynecology | Admitting: Gynecology

## 2016-07-29 DIAGNOSIS — Z1231 Encounter for screening mammogram for malignant neoplasm of breast: Secondary | ICD-10-CM

## 2016-07-31 ENCOUNTER — Other Ambulatory Visit: Payer: Self-pay | Admitting: Gynecology

## 2016-07-31 DIAGNOSIS — R928 Other abnormal and inconclusive findings on diagnostic imaging of breast: Secondary | ICD-10-CM

## 2016-08-05 ENCOUNTER — Ambulatory Visit
Admission: RE | Admit: 2016-08-05 | Discharge: 2016-08-05 | Disposition: A | Discharge status: Home / Self Care | Payer: BLUE CROSS/BLUE SHIELD | Source: Ambulatory Visit | Attending: Gynecology | Admitting: Gynecology

## 2016-08-05 ENCOUNTER — Other Ambulatory Visit: Payer: Self-pay | Admitting: Gynecology

## 2016-08-05 DIAGNOSIS — R928 Other abnormal and inconclusive findings on diagnostic imaging of breast: Secondary | ICD-10-CM

## 2016-08-05 DIAGNOSIS — N6489 Other specified disorders of breast: Secondary | ICD-10-CM | POA: Diagnosis not present

## 2016-08-06 ENCOUNTER — Other Ambulatory Visit: Payer: Self-pay | Admitting: Gynecology

## 2016-08-07 ENCOUNTER — Encounter: Payer: Self-pay | Admitting: Gynecology

## 2016-08-07 ENCOUNTER — Ambulatory Visit (INDEPENDENT_AMBULATORY_CARE_PROVIDER_SITE_OTHER): Payer: BLUE CROSS/BLUE SHIELD | Admitting: Gynecology

## 2016-08-07 VITALS — BP 116/70 | Ht 65.0 in | Wt 145.0 lb

## 2016-08-07 DIAGNOSIS — N949 Unspecified condition associated with female genital organs and menstrual cycle: Secondary | ICD-10-CM | POA: Diagnosis not present

## 2016-08-07 DIAGNOSIS — Z01419 Encounter for gynecological examination (general) (routine) without abnormal findings: Secondary | ICD-10-CM

## 2016-08-07 LAB — COMPREHENSIVE METABOLIC PANEL
ALBUMIN: 4.1 g/dL (ref 3.6–5.1)
ALT: 16 U/L (ref 6–29)
AST: 16 U/L (ref 10–35)
Alkaline Phosphatase: 54 U/L (ref 33–115)
BUN: 11 mg/dL (ref 7–25)
CALCIUM: 8.8 mg/dL (ref 8.6–10.2)
CHLORIDE: 104 mmol/L (ref 98–110)
CO2: 19 mmol/L — ABNORMAL LOW (ref 20–31)
CREATININE: 0.63 mg/dL (ref 0.50–1.10)
Glucose, Bld: 88 mg/dL (ref 65–99)
POTASSIUM: 3.9 mmol/L (ref 3.5–5.3)
SODIUM: 139 mmol/L (ref 135–146)
TOTAL PROTEIN: 6.5 g/dL (ref 6.1–8.1)
Total Bilirubin: 0.6 mg/dL (ref 0.2–1.2)

## 2016-08-07 LAB — URINALYSIS W MICROSCOPIC + REFLEX CULTURE
Bacteria, UA: NONE SEEN [HPF]
Bilirubin Urine: NEGATIVE
CASTS: NONE SEEN [LPF]
Crystals: NONE SEEN [HPF]
GLUCOSE, UA: NEGATIVE
Hgb urine dipstick: NEGATIVE
KETONES UR: NEGATIVE
LEUKOCYTES UA: NEGATIVE
NITRITE: NEGATIVE
PH: 6 (ref 5.0–8.0)
Protein, ur: NEGATIVE
RBC / HPF: NONE SEEN RBC/HPF (ref <=2)
SPECIFIC GRAVITY, URINE: 1.004 (ref 1.001–1.035)
WBC, UA: NONE SEEN WBC/HPF (ref <=5)
YEAST: NONE SEEN [HPF]

## 2016-08-07 LAB — CBC WITH DIFFERENTIAL/PLATELET
BASOS ABS: 42 {cells}/uL (ref 0–200)
Basophils Relative: 1 %
EOS ABS: 84 {cells}/uL (ref 15–500)
Eosinophils Relative: 2 %
HEMATOCRIT: 42.7 % (ref 35.0–45.0)
HEMOGLOBIN: 14.2 g/dL (ref 11.7–15.5)
LYMPHS ABS: 1386 {cells}/uL (ref 850–3900)
Lymphocytes Relative: 33 %
MCH: 29 pg (ref 27.0–33.0)
MCHC: 33.3 g/dL (ref 32.0–36.0)
MCV: 87.1 fL (ref 80.0–100.0)
MONO ABS: 336 {cells}/uL (ref 200–950)
MPV: 10.4 fL (ref 7.5–12.5)
Monocytes Relative: 8 %
NEUTROS ABS: 2352 {cells}/uL (ref 1500–7800)
NEUTROS PCT: 56 %
Platelets: 208 10*3/uL (ref 140–400)
RBC: 4.9 MIL/uL (ref 3.80–5.10)
RDW: 14.6 % (ref 11.0–15.0)
WBC: 4.2 10*3/uL (ref 3.8–10.8)

## 2016-08-07 NOTE — Patient Instructions (Signed)

## 2016-08-07 NOTE — Progress Notes (Signed)
    Kristin Cain May 16, 1970 [id number]        46 y.o.  G2P2 for annual exam.    Past medical history,surgical history, problem list, medications, allergies, family history and social history were all reviewed and documented as reviewed in the EPIC chart.  ROS:  Performed with pertinent positives and negatives included in the history, assessment and plan.   Additional significant findings :  None   Exam: Kristin Cain assistant Vitals:   08/07/16 0900  BP: 116/70  Weight: 145 lb (65.8 kg)  Height:  (1.651 m)   Body mass index is 24.13 kg/m.  General appearance:  Normal affect, orientation and appearance. Skin: Grossly normal HEENT: Without gross lesions.  No cervical or supraclavicular adenopathy. Thyroid normal.  Lungs:  Clear without wheezing, rales or rhonchi Cardiac: RR, without RMG Abdominal:  Soft, nontender, without masses, guarding, rebound, organomegaly or hernia Breasts:  Examined lying and sitting without masses, retractions, discharge or axillary adenopathy. Pelvic:  Ext, BUS, Vagina: Normal  Adnexa: Without masses or tenderness    Anus and perineum: Normal   Rectovaginal: Normal sphincter tone without palpated masses or tenderness.    Assessment/Plan:  46 y.o. G2P2 female for annual exam.   1. Status post TLH for bleeding 2011. Has been evaluated by integrative medicine clinic with a battery of blood work done. Currently on testosterone and progesterone supplementation. Had an estrogen level in the 500 range. Not having significant hot flushes or sweats. I reviewed with the patient issue of integrative medicine bioequivalent hormone replacement. The issues of no long-term improvement safety and possible adverse effects to include liver, lipid profile discussed. Patient will continue to follow up with the clinic as she chooses. 2. Persistent vaginitis. Treated multiple times with various regimens for yeast with persistent irritation. Has not noticed significant  benefit from the integrative medical treatment. She is on nystatin chronically to eradicate GI yeast. Had transiently tried boric acid for several days but stopped it in the past year vaginal burning. I recommended she restart boric acid suppositories twice weekly and increase to 3 times weekly and see if this does not suppress symptoms. Patient has a supply at home. Clinical exam today was normal and was not tested for yeast. 3. Mammography 07/2016. Had called back for. That ultimately showed benign cysts. They have a 6 month recall to relook at this area and she is going to follow up with them for this. SBE monthly reviewed. 4. Pap smear 2016. No Pap smear done today. History of LEEP a number of years ago. We'll plan repeat Pap smear vaginal cuff next year at 3 year interval for completeness. 5. Colonoscopy 2011. Repeat at their recommended interval. 6. Health maintenance. Baseline CBC, CMP and urinalysis due to her burning history ordered. Lipid profile last year 7. Not repeated. Follow up in one year, sooner as needed.   [name] MD, 9:28 AM 08/07/2016

## 2016-09-30 DIAGNOSIS — R5383 Other fatigue: Secondary | ICD-10-CM | POA: Diagnosis not present

## 2016-09-30 DIAGNOSIS — B779 Ascariasis, unspecified: Secondary | ICD-10-CM | POA: Diagnosis not present

## 2016-09-30 DIAGNOSIS — N951 Menopausal and female climacteric states: Secondary | ICD-10-CM | POA: Diagnosis not present

## 2016-09-30 DIAGNOSIS — B373 Candidiasis of vulva and vagina: Secondary | ICD-10-CM | POA: Diagnosis not present

## 2017-02-06 ENCOUNTER — Other Ambulatory Visit: Payer: BLUE CROSS/BLUE SHIELD

## 2017-02-11 ENCOUNTER — Other Ambulatory Visit: Payer: Self-pay | Admitting: Gynecology

## 2017-02-11 ENCOUNTER — Ambulatory Visit
Admission: RE | Admit: 2017-02-11 | Discharge: 2017-02-11 | Disposition: A | Discharge status: Home / Self Care | Payer: BLUE CROSS/BLUE SHIELD | Source: Ambulatory Visit | Attending: Gynecology | Admitting: Gynecology

## 2017-02-11 DIAGNOSIS — R928 Other abnormal and inconclusive findings on diagnostic imaging of breast: Secondary | ICD-10-CM

## 2017-02-11 DIAGNOSIS — R922 Inconclusive mammogram: Secondary | ICD-10-CM | POA: Diagnosis not present

## 2017-02-11 DIAGNOSIS — N6002 Solitary cyst of left breast: Secondary | ICD-10-CM

## 2017-02-11 DIAGNOSIS — N6320 Unspecified lump in the left breast, unspecified quadrant: Secondary | ICD-10-CM | POA: Diagnosis not present

## 2017-07-18 DIAGNOSIS — H524 Presbyopia: Secondary | ICD-10-CM | POA: Diagnosis not present

## 2017-07-18 DIAGNOSIS — H5213 Myopia, bilateral: Secondary | ICD-10-CM | POA: Diagnosis not present

## 2017-07-18 DIAGNOSIS — D3142 Benign neoplasm of left ciliary body: Secondary | ICD-10-CM | POA: Diagnosis not present

## 2017-08-07 ENCOUNTER — Other Ambulatory Visit: Payer: Self-pay | Admitting: Interventional Cardiology

## 2017-08-14 ENCOUNTER — Other Ambulatory Visit: Payer: BLUE CROSS/BLUE SHIELD

## 2017-08-20 ENCOUNTER — Ambulatory Visit
Admission: RE | Admit: 2017-08-20 | Discharge: 2017-08-20 | Disposition: A | Discharge status: Home / Self Care | Payer: BLUE CROSS/BLUE SHIELD | Source: Ambulatory Visit | Attending: Gynecology | Admitting: Gynecology

## 2017-08-20 DIAGNOSIS — N6002 Solitary cyst of left breast: Secondary | ICD-10-CM

## 2017-08-20 DIAGNOSIS — R922 Inconclusive mammogram: Secondary | ICD-10-CM | POA: Diagnosis not present

## 2018-01-08 ENCOUNTER — Ambulatory Visit (INDEPENDENT_AMBULATORY_CARE_PROVIDER_SITE_OTHER): Payer: BLUE CROSS/BLUE SHIELD | Admitting: Gynecology

## 2018-01-08 ENCOUNTER — Encounter: Payer: Self-pay | Admitting: Gynecology

## 2018-01-08 VITALS — BP 118/76

## 2018-01-08 DIAGNOSIS — R300 Dysuria: Secondary | ICD-10-CM

## 2018-01-08 DIAGNOSIS — N898 Other specified noninflammatory disorders of vagina: Secondary | ICD-10-CM | POA: Diagnosis not present

## 2018-01-08 LAB — WET PREP FOR TRICH, YEAST, CLUE

## 2018-01-08 MED ORDER — METRONIDAZOLE 500 MG PO TABS: 500.0000 mg | ORAL_TABLET | Freq: Two times a day (BID) | ORAL | 0 refills | Status: DC

## 2018-01-08 NOTE — Patient Instructions (Signed)
Take the Flagyl medication twice daily for 7 days.  Avoid alcohol while taking. 

## 2018-01-08 NOTE — Progress Notes (Signed)
    Kristin Cain January 27, 1971 [id number]        47 y.o.  G2P2 presents with mild dysuria on and off as well as some mild vaginal irritation with itching.  Has noticed this for over the past year.  No odor.  No significant urgency, frequency, low back pain, fever or chills.  Past medical history,surgical history, problem list, medications, allergies, family history and social history were all reviewed and documented in the EPIC chart.  Directed ROS with pertinent positives and negatives documented in the history of present illness/assessment and plan.  Exam: Kristin Cain assistant Vitals:   01/08/18 1217  BP: 118/76   General appearance:  Normal Spine straight without CVA tenderness Abdomen soft nontender without masses guarding rebound Pelvic external BUS vagina with whitish discharge.  Bimanual without masses or tenderness  Assessment/Plan:  47 y.o. G2P2 with history as above.  Wet prep is negative.  Urine analysis is negative.  She asked if I could culture this having noted a UTI in the past with a negative urine analysis but ultimately positive culture.  We will go ahead and culture her urine for completeness.  Will treat as a low-grade bacterial vaginosis.  Treatment options reviewed to include oral versus vaginal.  Patient elects for Flagyl 500 mg twice daily x7 days.  Alcohol avoidance reviewed.  Follow-up at scheduled annual exam next month, sooner if any issues.    [name] MD, 1:00 PM 01/08/2018

## 2018-01-09 ENCOUNTER — Encounter: Payer: Self-pay | Admitting: Gynecology

## 2018-01-09 ENCOUNTER — Other Ambulatory Visit: Payer: Self-pay | Admitting: Gynecology

## 2018-01-09 LAB — URINALYSIS, COMPLETE W/RFL CULTURE
BACTERIA UA: NONE SEEN /HPF
Bilirubin Urine: NEGATIVE
Glucose, UA: NEGATIVE
HGB URINE DIPSTICK: NEGATIVE
HYALINE CAST: NONE SEEN /LPF
KETONES UR: NEGATIVE
Leukocyte Esterase: NEGATIVE
Nitrites, Initial: NEGATIVE
PH: 7 (ref 5.0–8.0)
PROTEIN: NEGATIVE
RBC / HPF: NONE SEEN /HPF (ref 0–2)
Specific Gravity, Urine: 1.015 (ref 1.001–1.03)
WBC UA: NONE SEEN /HPF (ref 0–5)

## 2018-01-09 LAB — URINE CULTURE
MICRO NUMBER: 91189671
SPECIMEN QUALITY:: ADEQUATE

## 2018-01-09 LAB — NO CULTURE INDICATED

## 2018-01-09 MED ORDER — CLINDAMYCIN PHOSPHATE 2 % VA CREA: 1.0000 | TOPICAL_CREAM | Freq: Every day | VAGINAL | 0 refills | Status: AC

## 2018-01-09 NOTE — Telephone Encounter (Signed)
Tough call as to whether it was related or not.  Regardless I would just avoid the issue and use Cleocin vaginal cream nightly x7 nights

## 2018-02-23 ENCOUNTER — Encounter: Payer: BLUE CROSS/BLUE SHIELD | Admitting: Gynecology

## 2018-04-16 ENCOUNTER — Ambulatory Visit (INDEPENDENT_AMBULATORY_CARE_PROVIDER_SITE_OTHER): Payer: BLUE CROSS/BLUE SHIELD | Admitting: Gynecology

## 2018-04-16 ENCOUNTER — Encounter: Payer: Self-pay | Admitting: Gynecology

## 2018-04-16 VITALS — BP 118/76 | Ht 65.0 in | Wt 152.0 lb

## 2018-04-16 DIAGNOSIS — N951 Menopausal and female climacteric states: Secondary | ICD-10-CM

## 2018-04-16 DIAGNOSIS — Z13220 Encounter for screening for lipoid disorders: Secondary | ICD-10-CM

## 2018-04-16 DIAGNOSIS — Z01419 Encounter for gynecological examination (general) (routine) without abnormal findings: Secondary | ICD-10-CM | POA: Diagnosis not present

## 2018-04-16 NOTE — Patient Instructions (Signed)
Follow-up in 1 year for annual exam, sooner if any issues. 

## 2018-04-16 NOTE — Addendum Note (Signed)
Addended by: Dayna Barker on: 04/16/2018 03:25 PM   Modules accepted: Orders

## 2018-04-16 NOTE — Progress Notes (Signed)
    Kristin Cain 08-14-1970 [id number]        48 y.o.  G2P2 for annual gynecologic exam.  Having some hot flushes and sweats.  Past medical history,surgical history, problem list, medications, allergies, family history and social history were all reviewed and documented as reviewed in the EPIC chart.  ROS:  Performed with pertinent positives and negatives included in the history, assessment and plan.   Additional significant findings : None   Exam: [name] assistant Vitals:   04/16/18 1440  BP: 118/76  Weight: 152 lb (68.9 kg)  Height: 5\' 5"  (1.651 m)   Body mass index is 25.29 kg/m.  General appearance:  Normal affect, orientation and appearance. Skin: Grossly normal HEENT: Without gross lesions.  No cervical or supraclavicular adenopathy. Thyroid normal.  Lungs:  Clear without wheezing, rales or rhonchi Cardiac: RR, without RMG Abdominal:  Soft, nontender, without masses, guarding, rebound, organomegaly or hernia Breasts:  Examined lying and sitting without masses, retractions, discharge or axillary adenopathy. Pelvic:  Ext, BUS, Vagina: Normal.  Pap smear of vaginal cuff.  Adnexa: Without masses or tenderness    Anus and perineum: Normal   Rectovaginal: Normal sphincter tone without palpated masses or tenderness.    Assessment/Plan:  48 y.o. G2P2 female for annual gynecologic exam..   1. Having some hot flushes and sweats.  Status post [facility] 2011.  No other changes such as hair skin or weight.  Will check baseline FSH TSH.  Had been on testosterone and progesterone previously from elsewhere but discontinued. 2. Pap smear 2016.  Pap smear of vaginal cuff today.  History of LEEP a number of years ago. 3. Mammography 08/2017.  Continue with annual mammography when due.  Breast exam normal today. 4. Colonoscopy 2011.  Repeat at their recommended interval. 5. Health maintenance.  Baseline CBC, CMP, lipid profile, TSH, FSH ordered.  Follow-up in 1 year for annual  exam.   [name] MD, 3:16 PM 04/16/2018

## 2018-04-17 ENCOUNTER — Encounter: Payer: Self-pay | Admitting: Gynecology

## 2018-04-17 LAB — COMPREHENSIVE METABOLIC PANEL
AG Ratio: 1.9 (calc) (ref 1.0–2.5)
ALBUMIN MSPROF: 4.5 g/dL (ref 3.6–5.1)
ALKALINE PHOSPHATASE (APISO): 71 U/L (ref 33–115)
ALT: 25 U/L (ref 6–29)
AST: 21 U/L (ref 10–35)
BILIRUBIN TOTAL: 1.3 mg/dL — AB (ref 0.2–1.2)
BUN: 12 mg/dL (ref 7–25)
CALCIUM: 9.9 mg/dL (ref 8.6–10.2)
CO2: 24 mmol/L (ref 20–32)
CREATININE: 0.71 mg/dL (ref 0.50–1.10)
Chloride: 104 mmol/L (ref 98–110)
Globulin: 2.4 g/dL (calc) (ref 1.9–3.7)
Glucose, Bld: 86 mg/dL (ref 65–99)
Potassium: 4.4 mmol/L (ref 3.5–5.3)
Sodium: 138 mmol/L (ref 135–146)
TOTAL PROTEIN: 6.9 g/dL (ref 6.1–8.1)

## 2018-04-17 LAB — CBC WITH DIFFERENTIAL/PLATELET
Absolute Monocytes: 416 cells/uL (ref 200–950)
BASOS ABS: 80 {cells}/uL (ref 0–200)
Basophils Relative: 1.4 %
EOS PCT: 1.7 %
Eosinophils Absolute: 97 cells/uL (ref 15–500)
HEMATOCRIT: 43.3 % (ref 35.0–45.0)
Hemoglobin: 14.6 g/dL (ref 11.7–15.5)
LYMPHS ABS: 1972 {cells}/uL (ref 850–3900)
MCH: 29.1 pg (ref 27.0–33.0)
MCHC: 33.7 g/dL (ref 32.0–36.0)
MCV: 86.4 fL (ref 80.0–100.0)
MPV: 11 fL (ref 7.5–12.5)
Monocytes Relative: 7.3 %
NEUTROS PCT: 55 %
Neutro Abs: 3135 cells/uL (ref 1500–7800)
PLATELETS: 237 10*3/uL (ref 140–400)
RBC: 5.01 10*6/uL (ref 3.80–5.10)
RDW: 12.9 % (ref 11.0–15.0)
TOTAL LYMPHOCYTE: 34.6 %
WBC: 5.7 10*3/uL (ref 3.8–10.8)

## 2018-04-17 LAB — LIPID PANEL
CHOL/HDL RATIO: 2.6 (calc) (ref <5.0)
CHOLESTEROL: 217 mg/dL — AB (ref <200)
HDL: 85 mg/dL (ref >50)
LDL CHOLESTEROL (CALC): 117 mg/dL — AB
Non-HDL Cholesterol (Calc): 132 mg/dL (calc) — ABNORMAL HIGH (ref <130)
Triglycerides: 61 mg/dL (ref <150)

## 2018-04-17 LAB — FOLLICLE STIMULATING HORMONE: FSH: 90.5 m[IU]/mL

## 2018-04-17 LAB — TSH: TSH: 1.47 mIU/L

## 2018-04-20 LAB — PAP IG W/ RFLX HPV ASCU

## 2018-04-27 ENCOUNTER — Encounter: Payer: Self-pay | Admitting: Gynecology

## 2018-04-27 NOTE — Telephone Encounter (Signed)
They changed reference values which accounts for the differences.  All of them were normal range and not to worry

## 2018-11-25 DIAGNOSIS — D225 Melanocytic nevi of trunk: Secondary | ICD-10-CM | POA: Diagnosis not present

## 2018-11-25 DIAGNOSIS — Z808 Family history of malignant neoplasm of other organs or systems: Secondary | ICD-10-CM | POA: Diagnosis not present

## 2018-11-25 DIAGNOSIS — D2262 Melanocytic nevi of left upper limb, including shoulder: Secondary | ICD-10-CM | POA: Diagnosis not present

## 2018-11-25 DIAGNOSIS — D2272 Melanocytic nevi of left lower limb, including hip: Secondary | ICD-10-CM | POA: Diagnosis not present

## 2018-12-24 DIAGNOSIS — D3142 Benign neoplasm of left ciliary body: Secondary | ICD-10-CM | POA: Diagnosis not present

## 2018-12-24 DIAGNOSIS — H524 Presbyopia: Secondary | ICD-10-CM | POA: Diagnosis not present

## 2018-12-24 DIAGNOSIS — H5213 Myopia, bilateral: Secondary | ICD-10-CM | POA: Diagnosis not present

## 2018-12-30 ENCOUNTER — Encounter: Payer: Self-pay | Admitting: Gynecology

## 2019-01-08 ENCOUNTER — Other Ambulatory Visit: Payer: Self-pay

## 2019-01-08 DIAGNOSIS — Z20822 Contact with and (suspected) exposure to covid-19: Secondary | ICD-10-CM

## 2019-01-09 LAB — NOVEL CORONAVIRUS, NAA: SARS-CoV-2, NAA: NOT DETECTED

## 2019-02-03 ENCOUNTER — Other Ambulatory Visit: Payer: Self-pay

## 2019-02-03 DIAGNOSIS — Z20822 Contact with and (suspected) exposure to covid-19: Secondary | ICD-10-CM

## 2019-02-04 LAB — NOVEL CORONAVIRUS, NAA: SARS-CoV-2, NAA: NOT DETECTED

## 2019-02-07 DIAGNOSIS — L900 Lichen sclerosus et atrophicus: Secondary | ICD-10-CM

## 2019-02-07 HISTORY — DX: Lichen sclerosus et atrophicus: L90.0

## 2019-02-11 DIAGNOSIS — L814 Other melanin hyperpigmentation: Secondary | ICD-10-CM | POA: Diagnosis not present

## 2019-02-11 DIAGNOSIS — L811 Chloasma: Secondary | ICD-10-CM | POA: Diagnosis not present

## 2019-02-19 DIAGNOSIS — R5383 Other fatigue: Secondary | ICD-10-CM | POA: Diagnosis not present

## 2019-02-19 DIAGNOSIS — K589 Irritable bowel syndrome without diarrhea: Secondary | ICD-10-CM | POA: Diagnosis not present

## 2019-02-19 DIAGNOSIS — N760 Acute vaginitis: Secondary | ICD-10-CM | POA: Diagnosis not present

## 2019-02-19 DIAGNOSIS — N951 Menopausal and female climacteric states: Secondary | ICD-10-CM | POA: Diagnosis not present

## 2019-02-19 DIAGNOSIS — E559 Vitamin D deficiency, unspecified: Secondary | ICD-10-CM | POA: Diagnosis not present

## 2019-02-22 ENCOUNTER — Other Ambulatory Visit: Payer: Self-pay

## 2019-02-23 ENCOUNTER — Encounter: Payer: Self-pay | Admitting: Gynecology

## 2019-02-23 ENCOUNTER — Ambulatory Visit (INDEPENDENT_AMBULATORY_CARE_PROVIDER_SITE_OTHER): Payer: BC Managed Care – PPO | Admitting: Gynecology

## 2019-02-23 VITALS — BP 118/76

## 2019-02-23 DIAGNOSIS — N763 Subacute and chronic vulvitis: Secondary | ICD-10-CM | POA: Diagnosis not present

## 2019-02-23 DIAGNOSIS — N898 Other specified noninflammatory disorders of vagina: Secondary | ICD-10-CM

## 2019-02-23 DIAGNOSIS — L900 Lichen sclerosus et atrophicus: Secondary | ICD-10-CM

## 2019-02-23 LAB — WET PREP FOR TRICH, YEAST, CLUE

## 2019-02-23 NOTE — Progress Notes (Signed)
    Kristin Cain Jul 29, 1970 [id number]        48 y.o.  G2P2 presents complaining of vulvar irritation.  Recently saw her primary provider who examined her and said they saw an area on the right side of her vagina and recommended she come see me.  She has had an issue with recurrent vulvar irritation over the past year or 2.  Past medical history,surgical history, problem list, medications, allergies, family history and social history were all reviewed and documented in the EPIC chart.  Directed ROS with pertinent positives and negatives documented in the history of present illness/assessment and plan.  Exam: Caryn Bee assistant Vitals:   02/23/19 1233  BP: 118/76   General appearance:  Normal Abdomen soft nontender without masses guarding rebound Pelvic external BUS vagina with inflammatory excoriated changes with some whitish hyperkeratotic changes from the periclitoral to the lower labia majora bilaterally.  No specific lesions or ulcerations.  Vagina with slight white discharge.  Bimanual without masses or tenderness.  Colposcopy performed of the vulva confirming inflammatory changes but no specific lesions.  Colposcopy of the vagina was normal.  Physical Exam  Genitourinary:        Procedure: Representative areas of both the right and left labia majora were cleansed with Betadine, infiltrated with 1% lidocaine and skin biopsies taken.  Silver nitrate hemostasis afterwards.  Patient tolerated well.  Assessment/Plan:  48 y.o. G2P2 with excoriated hyperkeratotic vulvar changes.  Discussed differential to include possible lichen sclerosis.  Representative biopsies taken.  Patient will follow-up for results.  A wet prep was done which was negative.   Anastasio Auerbach MD, 2:02 PM 02/23/2019

## 2019-02-23 NOTE — Patient Instructions (Signed)
Office will call with biopsy results 

## 2019-02-25 ENCOUNTER — Encounter: Payer: Self-pay | Admitting: Gynecology

## 2019-02-25 LAB — PATHOLOGY REPORT

## 2019-02-25 LAB — TISSUE PATH REPORT

## 2019-02-26 ENCOUNTER — Other Ambulatory Visit: Payer: Self-pay

## 2019-02-26 MED ORDER — CLOBETASOL PROPIONATE 0.05 % EX CREA: TOPICAL_CREAM | CUTANEOUS | 0 refills | Status: DC

## 2019-04-20 ENCOUNTER — Encounter: Payer: BLUE CROSS/BLUE SHIELD | Admitting: Gynecology

## 2019-05-04 ENCOUNTER — Encounter: Payer: BC Managed Care – PPO | Admitting: Obstetrics and Gynecology

## 2019-06-21 ENCOUNTER — Other Ambulatory Visit: Payer: Self-pay | Admitting: Obstetrics & Gynecology

## 2019-06-21 DIAGNOSIS — N6002 Solitary cyst of left breast: Secondary | ICD-10-CM

## 2019-07-07 ENCOUNTER — Other Ambulatory Visit: Payer: BC Managed Care – PPO

## 2019-07-20 ENCOUNTER — Ambulatory Visit
Admission: RE | Admit: 2019-07-20 | Discharge: 2019-07-20 | Disposition: A | Discharge status: Home / Self Care | Payer: BC Managed Care – PPO | Source: Ambulatory Visit | Attending: Obstetrics & Gynecology | Admitting: Obstetrics & Gynecology

## 2019-07-20 ENCOUNTER — Other Ambulatory Visit: Payer: Self-pay

## 2019-07-20 DIAGNOSIS — N6002 Solitary cyst of left breast: Secondary | ICD-10-CM

## 2019-08-09 ENCOUNTER — Encounter: Payer: Self-pay | Admitting: Obstetrics & Gynecology

## 2019-08-09 ENCOUNTER — Ambulatory Visit (INDEPENDENT_AMBULATORY_CARE_PROVIDER_SITE_OTHER): Payer: BC Managed Care – PPO | Admitting: Obstetrics & Gynecology

## 2019-08-09 ENCOUNTER — Other Ambulatory Visit: Payer: Self-pay

## 2019-08-09 VITALS — BP 110/72 | Ht 65.5 in | Wt 147.0 lb

## 2019-08-09 DIAGNOSIS — Z01419 Encounter for gynecological examination (general) (routine) without abnormal findings: Secondary | ICD-10-CM | POA: Diagnosis not present

## 2019-08-09 DIAGNOSIS — N904 Leukoplakia of vulva: Secondary | ICD-10-CM

## 2019-08-09 DIAGNOSIS — Z780 Asymptomatic menopausal state: Secondary | ICD-10-CM

## 2019-08-09 MED ORDER — CLOBETASOL PROPIONATE 0.05 % EX CREA: TOPICAL_CREAM | CUTANEOUS | 4 refills | Status: DC

## 2019-08-09 NOTE — Progress Notes (Signed)
Kristin Cain 03/07/1971 [id number]   History:    49 y.o. G2P2L3 1 set of twin  RP:  Established patient for Annual-Gynecologic exam  HPI: S/P Total Laparoscopic Hysterectomy in 2011.  Postmenopause, well on no HRT.  No pelvic pain.  Vulvar irritation intermittently.  Dx of Lichen sclerosus of the vulva confirmed by Bx 02/2019.  Started on Clobetasol, but not using regularly.  No pain with IC.  Urine/BMs normal.  Breasts normal.  BMI 24.09.  Good fitness.  Fasting health labs here today.   Past medical history,surgical history, family history and social history were all reviewed and documented in the EPIC chart.  Gynecologic History No LMP recorded. Patient has had a hysterectomy.  Obstetric History OB History  Gravida Para Term Preterm AB Living  _0 SAB TAB Ectopic Multiple Live Births        1      # Outcome Date GA Lbr Len/2nd Weight Sex Delivery Anes PTL Lv  2 Para           1 Para              ROS: A ROS was performed and pertinent positives and negatives are included in the history.  GENERAL: No fevers or chills. HEENT: No change in vision, no earache, sore throat or sinus congestion. NECK: No pain or stiffness. CARDIOVASCULAR: No chest pain or pressure. No palpitations. PULMONARY: No shortness of breath, cough or wheeze. GASTROINTESTINAL: No abdominal pain, nausea, vomiting or diarrhea, melena or bright red blood per rectum. GENITOURINARY: No urinary frequency, urgency, hesitancy or dysuria. MUSCULOSKELETAL: No joint or muscle pain, no back pain, no recent trauma. DERMATOLOGIC: No rash, no itching, no lesions. ENDOCRINE: No polyuria, polydipsia, no heat or cold intolerance. No recent change in weight. HEMATOLOGICAL: No anemia or easy bruising or bleeding. NEUROLOGIC: No headache, seizures, numbness, tingling or weakness. PSYCHIATRIC: No depression, no loss of interest in normal activity or change in sleep pattern.     Exam:   BP 110/72   Ht 5' 5.5" (1.664  m)   Wt 147 lb (66.7 kg)   BMI 24.09 kg/m   Body mass index is 24.09 kg/m.  General appearance : Well developed well nourished female. No acute distress HEENT: Eyes: no retinal hemorrhage or exudates,  Neck supple, trachea midline, no carotid bruits, no thyroidmegaly Lungs: Clear to auscultation, no rhonchi or wheezes, or rib retractions  Heart: Regular rate and rhythm, no murmurs or gallops Breast:Examined in sitting and supine position were symmetrical in appearance, no palpable masses or tenderness,  no skin retraction, no nipple inversion, no nipple discharge, no skin discoloration, no axillary or supraclavicular lymphadenopathy Abdomen: no palpable masses or tenderness, no rebound or guarding Extremities: no edema or skin discoloration or tenderness  Pelvic: Vulva: Lichen Sclerosis with atrophy of the labia minora             Vagina: No gross lesions or discharge  Cervix/Uterus absent  Adnexa  Without masses or tenderness  Anus: Normal   Assessment/Plan:  49 y.o. female for annual exam   1. Well female exam with routine gynecological exam Gynecologic exam status post total hysterectomy and menopause.  Pap test January 2020 was negative, no indication to repeat this year.  Breast exam normal.  Screening mammogram April 2021 was benign.  Will refer to gastro for screening colonoscopy.  Fasting health labs here today.  Good body mass index at 24.09.  Continue with fitness and healthy nutrition. - CBC - Comp Met (CMET) - Lipid panel - TSH - VITAMIN D 25 Hydroxy (Vit-D Deficiency, Fractures)  2. Postmenopausal Well on no hormone replacement therapy.  Vitamin D supplements, calcium intake of 1200 mg daily and regular weightbearing physical activity is recommended.  3. Lichen sclerosus et atrophicus of the vulva Lichen sclerosus of the vulva.  Recommend using clobetasol cream in a thin application on the affected vulva twice a week.  Usage reviewed.  Prescription sent to  pharmacy.  Other orders - clobetasol cream (TEMOVATE) 0.05 %; Local thin application on affected vulva twice weekly.  Princess Bruins MD, 11:50 AM 08/09/2019

## 2019-08-09 NOTE — Patient Instructions (Signed)
1. Well female exam with routine gynecological exam Gynecologic exam status post total hysterectomy and menopause.  Pap test January 2020 was negative, no indication to repeat this year.  Breast exam normal.  Screening mammogram April 2021 was benign.  Will refer to gastro for screening colonoscopy.  Fasting health labs here today.  Good body mass index at 24.09.  Continue with fitness and healthy nutrition. - CBC - Comp Met (CMET) - Lipid panel - TSH - VITAMIN D 25 Hydroxy (Vit-D Deficiency, Fractures)  2. Postmenopausal Well on no hormone replacement therapy.  Vitamin D supplements, calcium intake of 1200 mg daily and regular weightbearing physical activity is recommended.  3. Lichen sclerosus et atrophicus of the vulva Lichen sclerosus of the vulva.  Recommend using clobetasol cream in a thin application on the affected vulva twice a week.  Usage reviewed.  Prescription sent to pharmacy.  Other orders - clobetasol cream (TEMOVATE) 0.05 %; Local thin application on affected vulva twice weekly.  Dorleen, it was a pleasure meeting you today!

## 2019-08-10 ENCOUNTER — Encounter: Payer: Self-pay | Admitting: *Deleted

## 2019-08-10 LAB — CBC
HCT: 48.1 % — ABNORMAL HIGH (ref 35.0–45.0)
Hemoglobin: 15.6 g/dL — ABNORMAL HIGH (ref 11.7–15.5)
MCH: 28.6 pg (ref 27.0–33.0)
MCHC: 32.4 g/dL (ref 32.0–36.0)
MCV: 88.3 fL (ref 80.0–100.0)
MPV: 11.6 fL (ref 7.5–12.5)
Platelets: 234 10*3/uL (ref 140–400)
RBC: 5.45 10*6/uL — ABNORMAL HIGH (ref 3.80–5.10)
RDW: 12.9 % (ref 11.0–15.0)
WBC: 5.1 10*3/uL (ref 3.8–10.8)

## 2019-08-10 LAB — COMPREHENSIVE METABOLIC PANEL
AG Ratio: 2 (calc) (ref 1.0–2.5)
ALT: 19 U/L (ref 6–29)
AST: 19 U/L (ref 10–35)
Albumin: 4.5 g/dL (ref 3.6–5.1)
Alkaline phosphatase (APISO): 80 U/L (ref 37–153)
BUN: 8 mg/dL (ref 7–25)
CO2: 26 mmol/L (ref 20–32)
Calcium: 10.1 mg/dL (ref 8.6–10.4)
Chloride: 104 mmol/L (ref 98–110)
Creat: 0.78 mg/dL (ref 0.50–1.05)
Globulin: 2.3 g/dL (calc) (ref 1.9–3.7)
Glucose, Bld: 103 mg/dL — ABNORMAL HIGH (ref 65–99)
Potassium: 4.4 mmol/L (ref 3.5–5.3)
Sodium: 141 mmol/L (ref 135–146)
Total Bilirubin: 1.6 mg/dL — ABNORMAL HIGH (ref 0.2–1.2)
Total Protein: 6.8 g/dL (ref 6.1–8.1)

## 2019-08-10 LAB — LIPID PANEL
Cholesterol: 204 mg/dL — ABNORMAL HIGH (ref <200)
HDL: 83 mg/dL (ref >=50)
LDL Cholesterol (Calc): 105 mg/dL (calc) — ABNORMAL HIGH
Non-HDL Cholesterol (Calc): 121 mg/dL (calc) (ref <130)
Total CHOL/HDL Ratio: 2.5 (calc) (ref <5.0)
Triglycerides: 75 mg/dL (ref <150)

## 2019-08-10 LAB — TSH: TSH: 1.14 mIU/L

## 2019-08-10 LAB — VITAMIN D 25 HYDROXY (VIT D DEFICIENCY, FRACTURES): Vit D, 25-Hydroxy: 42 ng/mL (ref 30–100)

## 2019-11-29 ENCOUNTER — Encounter: Payer: Self-pay | Admitting: Emergency Medicine

## 2019-11-29 ENCOUNTER — Emergency Department (INDEPENDENT_AMBULATORY_CARE_PROVIDER_SITE_OTHER): Payer: BC Managed Care – PPO

## 2019-11-29 ENCOUNTER — Emergency Department (INDEPENDENT_AMBULATORY_CARE_PROVIDER_SITE_OTHER)
Admission: EM | Admit: 2019-11-29 | Discharge: 2019-11-29 | Disposition: A | Discharge status: Home / Self Care | Payer: BC Managed Care – PPO | Source: Home / Self Care

## 2019-11-29 ENCOUNTER — Emergency Department: Admission: EM | Admit: 2019-11-29 | Discharge status: Absent without leave | Payer: Self-pay

## 2019-11-29 ENCOUNTER — Other Ambulatory Visit: Payer: Self-pay

## 2019-11-29 DIAGNOSIS — R109 Unspecified abdominal pain: Secondary | ICD-10-CM

## 2019-11-29 DIAGNOSIS — R1011 Right upper quadrant pain: Secondary | ICD-10-CM | POA: Diagnosis not present

## 2019-11-29 LAB — POCT CBC W AUTO DIFF (K'VILLE URGENT CARE)

## 2019-11-29 LAB — POCT URINALYSIS DIP (MANUAL ENTRY)
Bilirubin, UA: NEGATIVE
Blood, UA: NEGATIVE
Glucose, UA: NEGATIVE mg/dL
Leukocytes, UA: NEGATIVE
Nitrite, UA: NEGATIVE
Protein Ur, POC: NEGATIVE mg/dL
Spec Grav, UA: 1.015 (ref 1.010–1.025)
Urobilinogen, UA: 0.2 E.U./dL
pH, UA: 7 (ref 5.0–8.0)

## 2019-11-29 NOTE — Discharge Instructions (Addendum)
  Your ultrasound of gallbladder was normal today.   We are still waiting on some labs to help determine cause of your symptoms.  Try to stick with a bland diet for now.  Call to schedule an appointment with a primary care provider and GI (stomach) specialist.  Call 911 or go to the hospital if you develop severe pain, fever, unable to keep down fluids, or other new concerning symptoms develop.

## 2019-11-29 NOTE — ED Triage Notes (Addendum)
Rt flank pain, High Bilirubin, x 13 days, Holistic doctor suggested she get Ab [place], nausea after eating, has been watching what she eats. Unvaccinated

## 2019-11-29 NOTE — ED Provider Notes (Signed)
[facility]    CSN: [id number] Arrival date & time: 11/29/19  1335      History   Chief Complaint Chief Complaint  Patient presents with  . Flank Pain    HPI Kristin Cain is a 49 y.o. female.   HPI Kristin Cain is a 49 y.o. female presenting to UC with c/o 13 days of gradually elevating bilirubin.  She was seen by her holistic doctor recently for Right flank pain and advised to come get an ultrasound of her gallbladder. Pt c/o mild nausea and pain after eating.  She has tried to watch what she eats with mild relief. Denies fever, chills, n/v/d. Her mother had to have her gallbladder removed about 5 years ago.  Pt denies known hx of gallbladder issues.   Denies URI symptoms. Denies urinary symptoms. Denies hx of kidney stones.  She has not received the Covid-19 vaccine.  Past Medical History:  Diagnosis Date  . Lichen sclerosus 02/2019   Biopsy-proven  . Seasonal allergies     There are no problems to display for this patient.   Past Surgical History:  Procedure Laterality Date  . APPENDECTOMY    . CERVICAL BIOPSY  W/ LOOP ELECTRODE EXCISION    . CESAREAN SECTION     x 2 BTL  . COLPOSCOPY    . ENDOMETRIAL ABLATION  2007  . LAPAROSCOPIC TOTAL HYSTERECTOMY  03/2010  . PELVIC LAPAROSCOPY    . WISDOM TOOTH EXTRACTION      OB History    Gravida  2   Para  2   Term      Preterm      AB      Living  3     SAB      TAB      Ectopic      Multiple  1   Live Births               Home Medications    Prior to Admission medications   Medication Sig Start Date End Date Taking? Authorizing Provider  Cholecalciferol (VITAMIN D PO) Take by mouth.    [provider]  Probiotic Product (PROBIOTIC PO) Take by mouth.    [provider]    Family History Family History  Problem Relation Age of Onset  . Diabetes Maternal Grandmother   . Hyperlipidemia Mother   . Hypertension Mother   . Hyperlipidemia Father     . Hypertension Father   . Breast cancer Neg Hx     Social History Social History   Tobacco Use  . Smoking status: Never Smoker  . Smokeless tobacco: Never Used  Vaping Use  . Vaping Use: Never used  Substance Use Topics  . Alcohol use: Yes    Alcohol/week: 0.0 standard drinks    Comment: OCC  . Drug use: No     Allergies   Morphine and related   Review of Systems Review of Systems  Constitutional: Negative for chills and fever.  HENT: Negative for congestion, ear pain, sore throat, trouble swallowing and voice change.   Respiratory: Negative for cough and shortness of breath.   Cardiovascular: Negative for chest pain and palpitations.  Gastrointestinal: Positive for abdominal pain and nausea. Negative for diarrhea and vomiting.  Genitourinary: Positive for flank pain (right). Negative for dysuria and frequency.  Musculoskeletal: Negative for arthralgias, back pain and myalgias.  Skin: Negative for rash.  Neurological: Negative for dizziness, light-headedness and headaches.  All other  systems reviewed and are negative.    Physical Exam Triage Vital Signs ED Triage Vitals  Enc Vitals Group     BP 11/29/19 1416 127/78     Pulse Rate 11/29/19 1415 61     Resp --      Temp 11/29/19 1415 (!) 97.4 F (36.3 C)     Temp Source 11/29/19 1415 Oral     SpO2 11/29/19 1416 99 %     Weight 11/29/19 1416 145 lb (65.8 kg)     Height 11/29/19 1416 5\' 5"  (1.651 m)     Head Circumference --      Peak Flow --      Pain Score 11/29/19 1416 2     Pain Loc --      Pain Edu? --      Excl. in GC? --    No data found.  Updated Vital Signs BP 127/78 (BP Location: Right Arm)   Pulse 61   Temp (!) 97.4 F (36.3 C) (Oral)   Ht 5\' 5"  (1.651 m)   Wt 145 lb (65.8 kg)   SpO2 99%   BMI 24.13 kg/m   Visual Acuity Right Eye Distance:   Left Eye Distance:   Bilateral Distance:    Right Eye Near:   Left Eye Near:    Bilateral Near:     Physical Exam Vitals and nursing note  reviewed.  Constitutional:      General: She is not in acute distress.    Appearance: Normal appearance. She is well-developed. She is not ill-appearing, toxic-appearing or diaphoretic.  HENT:     Head: Normocephalic and atraumatic.     Nose: Nose normal.     Mouth/Throat:     Mouth: Mucous membranes are moist.  Cardiovascular:     Rate and Rhythm: Normal rate and regular rhythm.  Pulmonary:     Effort: Pulmonary effort is normal. No respiratory distress.     Breath sounds: Normal breath sounds. No stridor. No wheezing, rhonchi or rales.  Abdominal:     General: There is no distension.     Palpations: Abdomen is soft. There is no mass.     Tenderness: There is abdominal tenderness in the right upper quadrant and epigastric area. There is no right CVA tenderness, left CVA tenderness, guarding or rebound.     Hernia: No hernia is present.  Musculoskeletal:        General: Normal range of motion.     Cervical back: Normal range of motion.  Skin:    General: Skin is warm and dry.  Neurological:     Mental Status: She is alert and oriented to person, place, and time.  Psychiatric:        Behavior: Behavior normal.      UC Treatments / Results  Labs (all labs ordered are listed, but only abnormal results are displayed) Labs Reviewed  POCT URINALYSIS DIP (MANUAL ENTRY) - Abnormal; Notable for the following components:      Result Value   Ketones, POC UA small (15) (*)    All other components within normal limits  COMPLETE METABOLIC PANEL WITH GFR  LIPASE  POCT CBC W AUTO DIFF ([facility])    EKG   Radiology 12/01/19 Abdomen Limited RUQ  Result Date: 11/29/2019 CLINICAL DATA:  Right upper quadrant pain EXAM: ULTRASOUND ABDOMEN LIMITED RIGHT UPPER QUADRANT COMPARISON:  05/04/2013 FINDINGS: Gallbladder: No gallstones or wall thickening visualized. No sonographic Murphy sign noted by sonographer. Common bile duct:  Diameter: 2.4 mm Liver: No focal lesion identified. Within  normal limits in parenchymal echogenicity. Portal vein is patent on color Doppler imaging with normal direction of blood flow towards the liver. Other: None. IMPRESSION: Negative right upper quadrant abdominal ultrasound Electronically Signed   By: Jasmine Pang M.D.   On: 11/29/2019 16:01    Procedures Procedures (including critical care time)  Medications Ordered in UC Medications - No data to display  Initial Impression / Assessment and Plan / UC Course  I have reviewed the triage vital signs and the nursing notes.  Pertinent labs & imaging results that were available during my care of the patient were reviewed by me and considered in my medical decision making (see chart for details).     CBC unremarkable  [place] RUQ: normal CMP pending at time of discharge  F/u with PCP AVS given  Final Clinical Impressions(s) / UC Diagnoses   Final diagnoses:  RUQ abdominal pain  Right flank pain     Discharge Instructions      Your ultrasound of gallbladder was normal today.   We are still waiting on some labs to help determine cause of your symptoms.  Try to stick with a bland diet for now.  Call to schedule an appointment with a primary care provider and GI (stomach) specialist.  Call 911 or go to the hospital if you develop severe pain, fever, unable to keep down fluids, or other new concerning symptoms develop.     ED Prescriptions    None     PDMP not reviewed this encounter.   Lurene Shadow, [place] 12/01/19 2121

## 2019-11-30 LAB — COMPLETE METABOLIC PANEL WITH GFR
AG Ratio: 2 (calc) (ref 1.0–2.5)
ALT: 18 U/L (ref 6–29)
AST: 18 U/L (ref 10–35)
Albumin: 5 g/dL (ref 3.6–5.1)
Alkaline phosphatase (APISO): 84 U/L (ref 37–153)
BUN/Creatinine Ratio: 9 (calc) (ref 6–22)
BUN: 6 mg/dL — ABNORMAL LOW (ref 7–25)
CO2: 28 mmol/L (ref 20–32)
Calcium: 10.4 mg/dL (ref 8.6–10.4)
Chloride: 102 mmol/L (ref 98–110)
Creat: 0.7 mg/dL (ref 0.50–1.05)
GFR, Est African American: 116 mL/min/{1.73_m2} (ref >=60)
GFR, Est Non African American: 100 mL/min/{1.73_m2} (ref >=60)
Globulin: 2.5 g/dL (calc) (ref 1.9–3.7)
Glucose, Bld: 83 mg/dL (ref 65–99)
Potassium: 4.1 mmol/L (ref 3.5–5.3)
Sodium: 140 mmol/L (ref 135–146)
Total Bilirubin: 1.8 mg/dL — ABNORMAL HIGH (ref 0.2–1.2)
Total Protein: 7.5 g/dL (ref 6.1–8.1)

## 2019-11-30 LAB — LIPASE: Lipase: 16 U/L (ref 7–60)

## 2020-07-03 ENCOUNTER — Other Ambulatory Visit: Payer: Self-pay | Admitting: Obstetrics & Gynecology

## 2020-07-03 DIAGNOSIS — Z1231 Encounter for screening mammogram for malignant neoplasm of breast: Secondary | ICD-10-CM

## 2020-08-10 ENCOUNTER — Encounter: Payer: BC Managed Care – PPO | Admitting: Obstetrics & Gynecology

## 2020-08-10 DIAGNOSIS — Z0289 Encounter for other administrative examinations: Secondary | ICD-10-CM

## 2020-08-17 ENCOUNTER — Other Ambulatory Visit: Payer: Self-pay

## 2020-08-17 ENCOUNTER — Ambulatory Visit
Admission: RE | Admit: 2020-08-17 | Discharge: 2020-08-17 | Disposition: A | Discharge status: Home / Self Care | Payer: BC Managed Care – PPO | Source: Ambulatory Visit

## 2020-08-17 DIAGNOSIS — Z1231 Encounter for screening mammogram for malignant neoplasm of breast: Secondary | ICD-10-CM

## 2020-11-10 NOTE — Progress Notes (Signed)
[name] [address] [place] [address] Phone: [phone] Subjective:   Kristin Cain, am serving as a scribe for Dr. Antoine Primas.  I'm seeing this patient by the request  of:  Patient, No Pcp Per (Inactive)  CC: Foot pain follow-up  [id number]  Kristin Cain is a 50 y.o. female coming in with complaint of right foot pain. Patient states that she has been having the pain for months not sure if it was from her shoes being tied too tight or when she did burpees barefoot. Locates pain to top of the foot by toes and radiates up to the ankle. Going on tip toes will have sharp pain and dull pain constantly. Patient unable to do her daily walks and workouts and pain will sometimes wake her up at night. Patient stopped her walks and yoga to see if that would help and it has not alleviated the pain.       Past Medical History:  Diagnosis Date   Lichen sclerosus 02/2019   Biopsy-proven   Seasonal allergies    Past Surgical History:  Procedure Laterality Date   APPENDECTOMY     CERVICAL BIOPSY  W/ LOOP ELECTRODE EXCISION     CESAREAN SECTION     x 2 BTL   COLPOSCOPY     ENDOMETRIAL ABLATION  2007   LAPAROSCOPIC TOTAL HYSTERECTOMY  03/2010   PELVIC LAPAROSCOPY     WISDOM TOOTH EXTRACTION     Social History   Socioeconomic History   Marital status: Married    Spouse name: Not on file   Number of children: Not on file   Years of education: Not on file   Highest education level: Not on file  Occupational History   Not on file  Tobacco Use   Smoking status: Never   Smokeless tobacco: Never  Vaping Use   Vaping Use: Never used  Substance and Sexual Activity   Alcohol use: Yes    Alcohol/week: 0.0 standard drinks    Comment: OCC   Drug use: No   Sexual activity: Yes    Birth control/protection: Surgical    Comment: HYST-1st intercourse 15 yo--5 partners, MARRIED- 38 YRS  Other Topics Concern   Not on file  Social  History Narrative   Not on file   Social Determinants of Health   Financial Resource Strain: Not on file  Food Insecurity: Not on file  Transportation Needs: Not on file  Physical Activity: Not on file  Stress: Not on file  Social Connections: Not on file   Allergies  Allergen Reactions   Morphine And Related Itching   Family History  Problem Relation Age of Onset   Diabetes Maternal Grandmother    Hyperlipidemia Mother    Hypertension Mother    Hyperlipidemia Father    Hypertension Father    Breast cancer Neg Hx          Current Outpatient Medications (Other):    Cholecalciferol (VITAMIN D PO), Take by mouth.   gabapentin (NEURONTIN) 100 MG capsule, Take two capsules daily at bedtime   Probiotic Product (PROBIOTIC PO), Take by mouth.   Reviewed prior external information including notes and imaging from  primary care provider As well as notes that were available from care everywhere and other healthcare systems.  Past medical history, social, surgical and family history all reviewed in electronic medical record.  No pertanent information unless stated regarding to the chief complaint.   Review of  Systems:  No headache, visual changes, nausea, vomiting, diarrhea, constipation, dizziness, abdominal pain, skin rash, fevers, chills, night sweats, weight loss, swollen lymph nodes, body aches, joint swelling, chest pain, shortness of breath, mood changes. POSITIVE muscle aches  Objective  Blood pressure 100/62, pulse 70, height 5\' 5"  (1.651 m), weight 154 lb (69.9 kg), SpO2 96 %.   General: No apparent distress alert and oriented x3 mood and affect normal, dressed appropriately.  HEENT: Pupils equal, extraocular movements intact  Respiratory: Patient's speak in full sentences and does not appear short of breath  Cardiovascular: No lower extremity edema, non tender, no erythema  Gait normal with good balance and coordination.  MSK:   Foot pain.  Patient on exam does  have mild positive squeeze test.  Significant breakdown of the transverse arch noted.  Rigid midfoot noted.  Good range of motion of the ankle.  Limited muscular skeletal ultrasound was performed and interpreted by , M  Limited ultrasound shows the patient does have what appears to be hypoechoic changes that is consistent with a potential neuroma between the second intermetatarsal space.  Very large dilation noted of the nerve.  No cortical irregularities are noted otherwise. Impression: neuroma    Impression and Recommendations:    The above documentation has been reviewed and is accurate and complete Antoine Primas, DO

## 2020-11-13 ENCOUNTER — Ambulatory Visit: Payer: Self-pay

## 2020-11-13 ENCOUNTER — Ambulatory Visit (INDEPENDENT_AMBULATORY_CARE_PROVIDER_SITE_OTHER): Payer: BC Managed Care – PPO | Admitting: Family Medicine

## 2020-11-13 ENCOUNTER — Other Ambulatory Visit: Payer: Self-pay

## 2020-11-13 ENCOUNTER — Encounter: Payer: Self-pay | Admitting: Family Medicine

## 2020-11-13 VITALS — BP 100/62 | HR 70 | Ht 65.0 in | Wt 154.0 lb

## 2020-11-13 DIAGNOSIS — G5761 Lesion of plantar nerve, right lower limb: Secondary | ICD-10-CM | POA: Diagnosis not present

## 2020-11-13 DIAGNOSIS — M79671 Pain in right foot: Secondary | ICD-10-CM

## 2020-11-13 MED ORDER — GABAPENTIN 100 MG PO CAPS: ORAL_CAPSULE | ORAL | 3 refills | Status: DC

## 2020-11-13 NOTE — Patient Instructions (Addendum)
Good to see you  Exercises given Spenco total support orthotics Voltaren OTC Gabapentin 200mg  Hoka or Oofos recovery sandals for in the house  Hoka arahi shoes  See me again in 6-8 weeks

## 2020-11-13 NOTE — Assessment & Plan Note (Signed)
Patient on ultrasound does seem to have the lumbar neuroma noted.  Patient does have a rigid midfoot and breakdown of the transverse arch.  Patient is going to try over-the-counter orthotics, we discussed proper shoes.  Discussed other icing regimen and home exercises.  Increase activity slowly.  Patient will avoid being barefoot in the house.  Get a low-dose of gabapentin to help her with nighttime pain and some of the cramping.  Follow-up again in 6 weeks if worsening pain consider formal physical therapy or injections

## 2022-01-01 IMAGING — MG MM DIGITAL SCREENING BILAT W/ TOMO AND CAD
8 series · 8 of 24 positions shown · non-contrast
Comparison: Previous exam(s).

CLINICAL DATA: Screening.

EXAM:
DIGITAL SCREENING BILATERAL MAMMOGRAM WITH TOMOSYNTHESIS AND CAD
TECHNIQUE: Bilateral screening digital craniocaudal and mediolateral oblique
mammograms were obtained. Bilateral screening digital breast
tomosynthesis was performed. The images were evaluated with
computer-aided detection.

[R CC synth-2D]
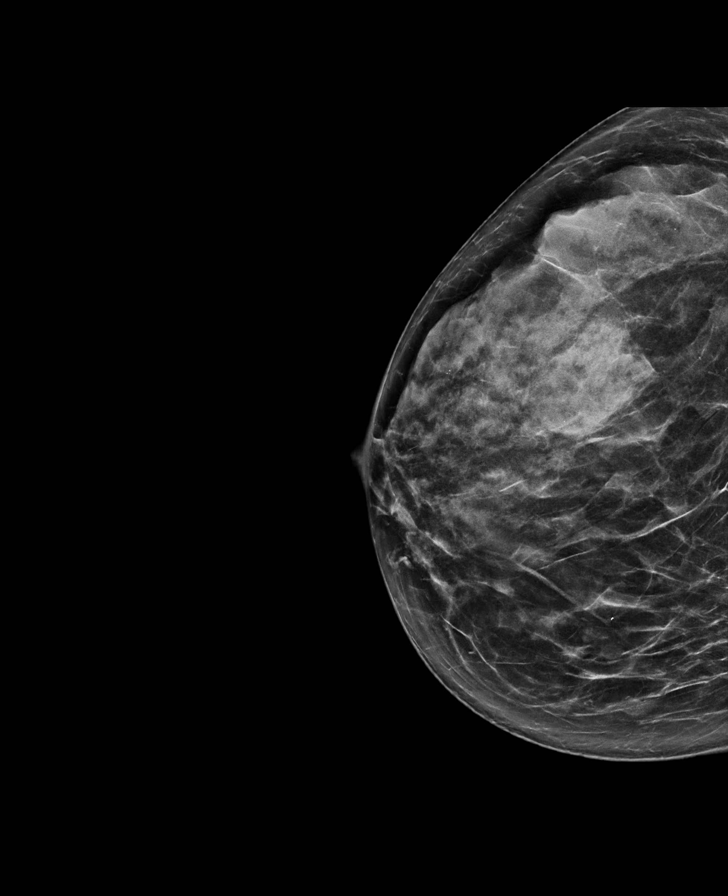

[L MLO synth-2D]
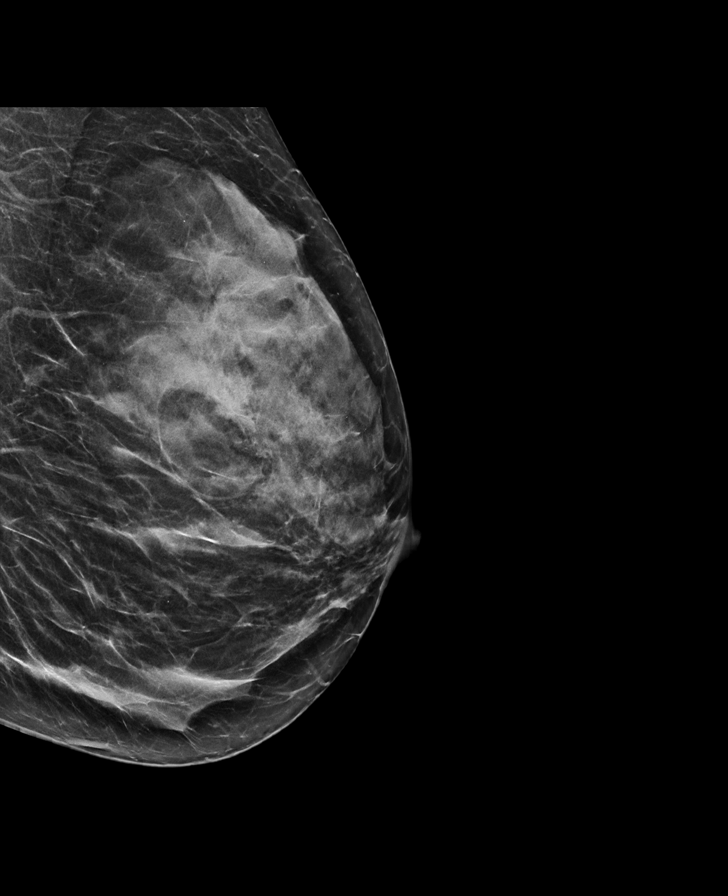

[L CC synth-2D]
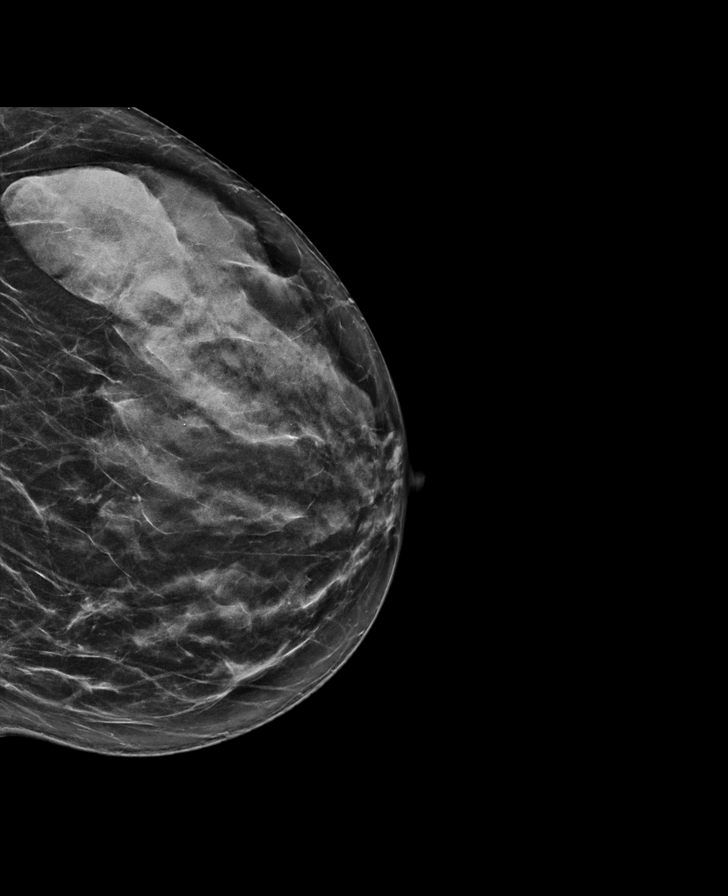

[R MLO synth-2D]
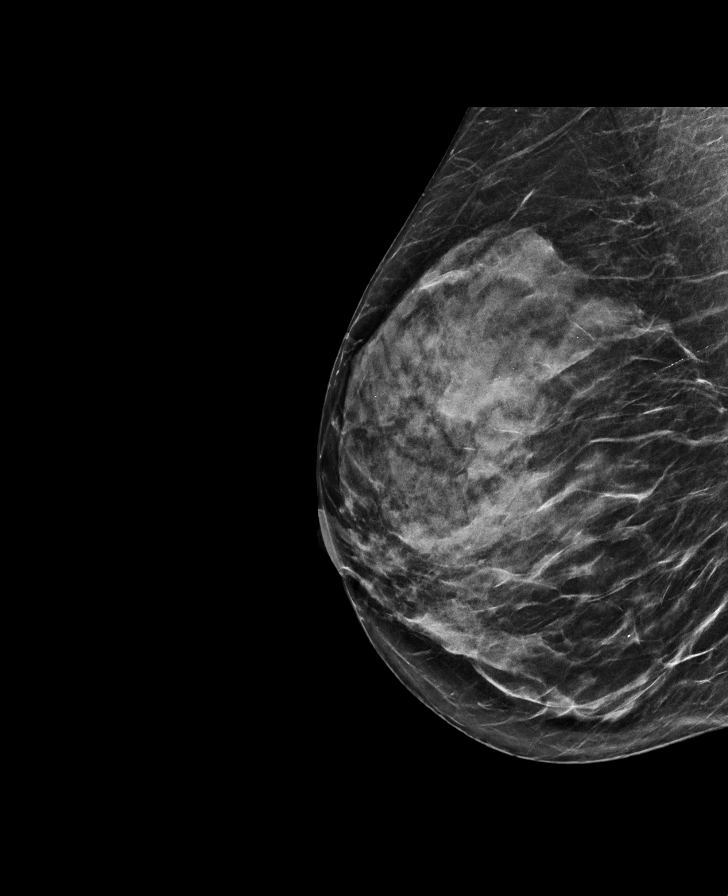

[L MLO tomo · tomo slice 29/57.0]
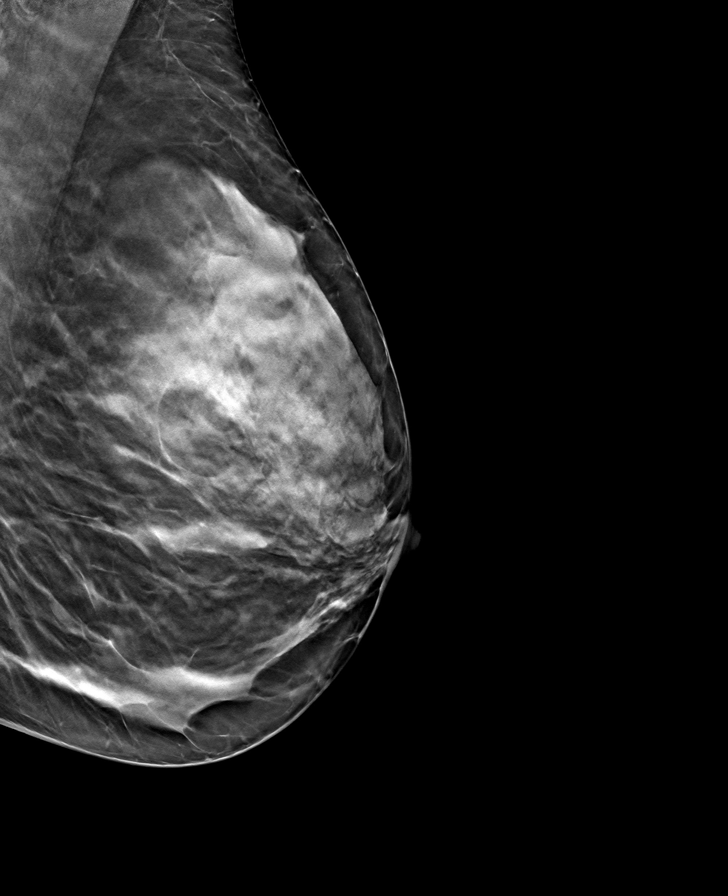

[R CC tomo · tomo slice 33/64.0]
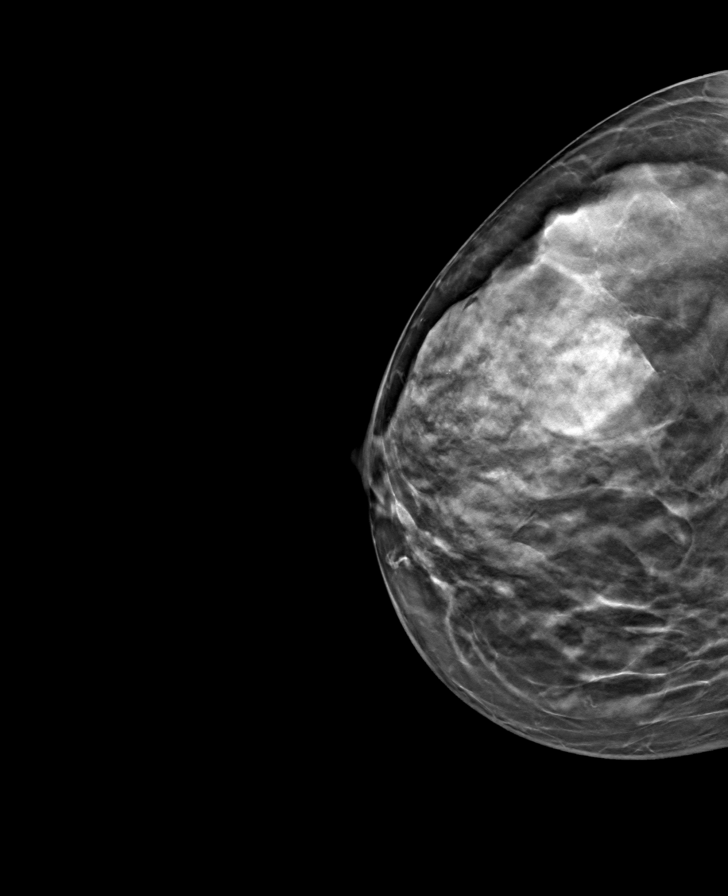

[L CC tomo · tomo slice 32/63.0]
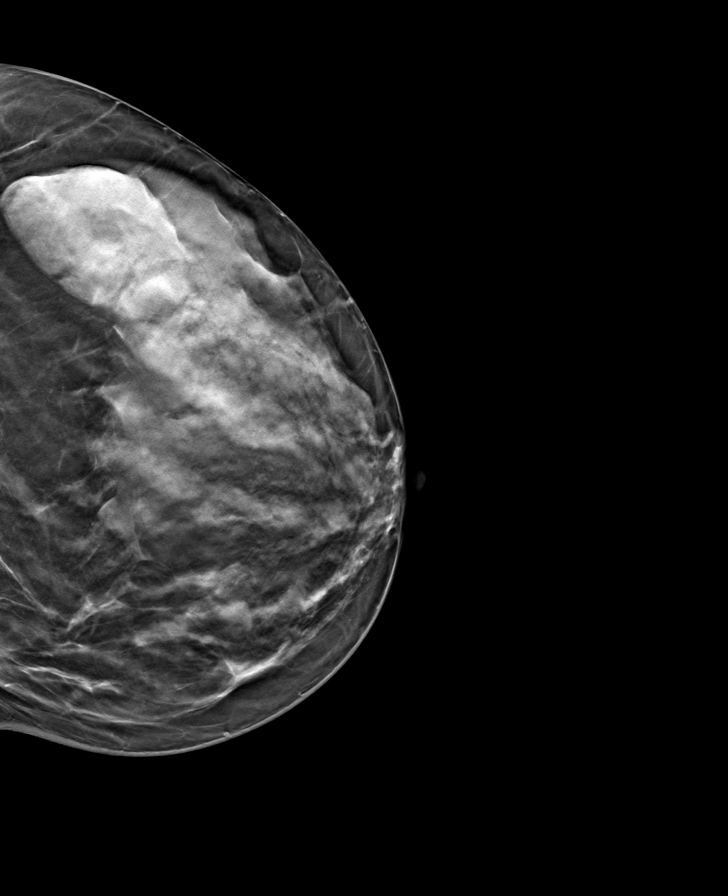

[R MLO tomo · tomo slice 30/59.0]
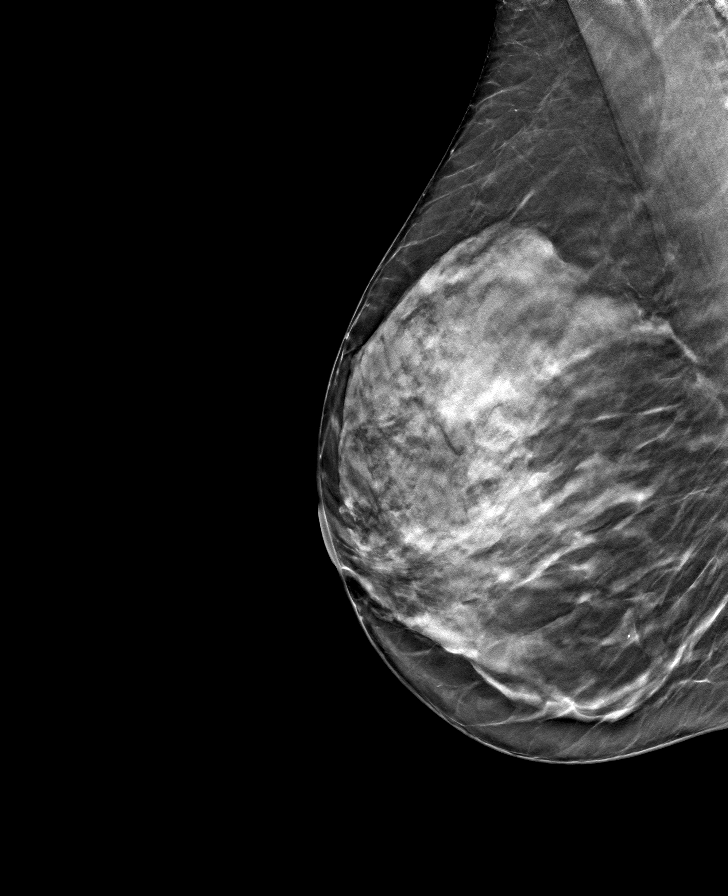

[8 of 24 positions shown; findings below may reference images not displayed]

ACR Breast Density Category c: The breast tissue is heterogeneously
dense, which may obscure small masses.
FINDINGS: There are no findings suspicious for malignancy. The images were
evaluated with computer-aided detection.
IMPRESSION: No mammographic evidence of malignancy. A result letter of this
screening mammogram will be mailed directly to the patient.

RECOMMENDATION:
Screening mammogram in one year. (Code:T4-5-GWO)

BI-RADS CATEGORY  1: Negative.

## 2022-07-29 ENCOUNTER — Other Ambulatory Visit: Payer: Self-pay | Admitting: Obstetrics & Gynecology

## 2022-07-29 DIAGNOSIS — Z1231 Encounter for screening mammogram for malignant neoplasm of breast: Secondary | ICD-10-CM

## 2022-08-01 ENCOUNTER — Encounter: Payer: Self-pay | Admitting: Obstetrics & Gynecology

## 2022-08-01 ENCOUNTER — Ambulatory Visit (INDEPENDENT_AMBULATORY_CARE_PROVIDER_SITE_OTHER): Payer: BC Managed Care – PPO | Admitting: Obstetrics & Gynecology

## 2022-08-01 VITALS — BP 110/70 | HR 70 | Temp 98.1°F

## 2022-08-01 DIAGNOSIS — R300 Dysuria: Secondary | ICD-10-CM

## 2022-08-01 LAB — URINALYSIS, COMPLETE W/RFL CULTURE
Bacteria, UA: NONE SEEN /HPF
Bilirubin Urine: NEGATIVE
Glucose, UA: NEGATIVE
Hyaline Cast: NONE SEEN /LPF
Ketones, ur: NEGATIVE
Leukocyte Esterase: NEGATIVE
Nitrites, Initial: NEGATIVE
Protein, ur: NEGATIVE
RBC / HPF: NONE SEEN /HPF (ref 0–2)
Specific Gravity, Urine: 1.025 (ref 1.001–1.035)
pH: 6 (ref 5.0–8.0)

## 2022-08-01 NOTE — Progress Notes (Signed)
    Kristin Cain Mar 07, 1971 [id number]        52 y.o.  G2P2L3   RP: Burning and frequency of urination  HPI: Patient c/o burning and frequency of urination intermittently in the morning.  No blood in urine.  No fever.  Stopped drinking coffee 2 days ago.  No vaginal discharge.  No itching or odor.  No pelvic pain.  No need for STI screen.   OB History  Gravida Para Term Preterm AB Living  SAB IAB Ectopic Multiple Live Births        1      # Outcome Date GA Lbr Len/2nd Weight Sex Delivery Anes PTL Lv  2 Para           1 Para             Past medical history,surgical history, problem list, medications, allergies, family history and social history were all reviewed and documented in the EPIC chart.   Directed ROS with pertinent positives and negatives documented in the history of present illness/assessment and plan.  Exam:  Vitals:   08/01/22 1413  BP: 110/70  Pulse: 70  Temp: 98.1 F (36.7 C)  TempSrc: Oral  SpO2: 97%   General appearance:  Normal  CVAT Neg bilaterally  Abdomen: Normal  Gynecologic exam: Deferred  U/A: Yellow, clear, Pro Neg, Nit Neg, WBC 0-5, RBC Neg, Bacteria Neg.  U. Culture pending.   Assessment/Plan:  52 y.o. G2P2L2   1. Burning with urination U/A minimally perturbed.  Probably no acute cystitis.  Will wait on U. Culture.  Recommend good intake of water.  Avoid caffeine products. - Urinalysis,Complete w/RFL Culture   Genia Del MD, 2:21 PM 08/01/2022

## 2022-08-23 ENCOUNTER — Ambulatory Visit
Admission: RE | Admit: 2022-08-23 | Discharge: 2022-08-23 | Disposition: A | Discharge status: Home / Self Care | Payer: BC Managed Care – PPO | Source: Ambulatory Visit | Attending: Obstetrics & Gynecology | Admitting: Obstetrics & Gynecology

## 2022-08-23 DIAGNOSIS — Z1231 Encounter for screening mammogram for malignant neoplasm of breast: Secondary | ICD-10-CM

## 2022-10-08 ENCOUNTER — Ambulatory Visit (INDEPENDENT_AMBULATORY_CARE_PROVIDER_SITE_OTHER): Payer: BC Managed Care – PPO | Admitting: Obstetrics & Gynecology

## 2022-10-08 ENCOUNTER — Encounter: Payer: Self-pay | Admitting: Obstetrics & Gynecology

## 2022-10-08 VITALS — BP 114/70 | HR 68 | Ht 64.75 in | Wt 157.0 lb

## 2022-10-08 DIAGNOSIS — Z90710 Acquired absence of both cervix and uterus: Secondary | ICD-10-CM | POA: Diagnosis not present

## 2022-10-08 DIAGNOSIS — Z780 Asymptomatic menopausal state: Secondary | ICD-10-CM

## 2022-10-08 DIAGNOSIS — Z01419 Encounter for gynecological examination (general) (routine) without abnormal findings: Secondary | ICD-10-CM

## 2022-10-08 NOTE — Progress Notes (Signed)
Kristin Cain November 01, 1970 [id number]   History:    52 y.o.  G2P2L3 1 set of twin   RP:  Established patient for Annual-Gynecologic exam   HPI: S/P Total Laparoscopic Hysterectomy in 2011.  Postmenopause, well on no HRT.  No pelvic pain.  Vulva normal.  Dx of Lichen sclerosus of the vulva confirmed by Bx in 02/2019.  Started on Clobetasol at that time, but not using anymore.  No pain with IC. Pap Neg 04/2018. Urine/BMs normal. Colono 2021 or 2022.  Breasts normal.  Mammo Neg 08/2022. BMI 26.33.  Good fitness.  Recommend establishing with a Fam MD.     Past medical history,surgical history, family history and social history were all reviewed and documented in the EPIC chart.  Gynecologic History No LMP recorded. Patient has had a hysterectomy.  Obstetric History OB History  Gravida Para Term Preterm AB Living  2 2       3   SAB IAB Ectopic Multiple Live Births        1      # Outcome Date GA Lbr Len/2nd Weight Sex Delivery Anes PTL Lv  2 Para           1 Para              ROS: A ROS was performed and pertinent positives and negatives are included in the history. GENERAL: No fevers or chills. HEENT: No change in vision, no earache, sore throat or sinus congestion. NECK: No pain or stiffness. CARDIOVASCULAR: No chest pain or pressure. No palpitations. PULMONARY: No shortness of breath, cough or wheeze. GASTROINTESTINAL: No abdominal pain, nausea, vomiting or diarrhea, melena or bright red blood per rectum. GENITOURINARY: No urinary frequency, urgency, hesitancy or dysuria. MUSCULOSKELETAL: No joint or muscle pain, no back pain, no recent trauma. DERMATOLOGIC: No rash, no itching, no lesions. ENDOCRINE: No polyuria, polydipsia, no heat or cold intolerance. No recent change in weight. HEMATOLOGICAL: No anemia or easy bruising or bleeding. NEUROLOGIC: No headache, seizures, numbness, tingling or weakness. PSYCHIATRIC: No depression, no loss of interest in normal activity or change in sleep  pattern.     Exam:   BP 114/70   Pulse 68   Ht 5' 4.75" (1.645 m)   Wt 157 lb (71.2 kg)   SpO2 98%   BMI 26.33 kg/m   Body mass index is 26.33 kg/m.  General appearance : Well developed well nourished female. No acute distress HEENT: Eyes: no retinal hemorrhage or exudates,  Neck supple, trachea midline, no carotid bruits, no thyroidmegaly Lungs: Clear to auscultation, no rhonchi or wheezes, or rib retractions  Heart: Regular rate and rhythm, no murmurs or gallops Breast:Examined in sitting and supine position were symmetrical in appearance, no palpable masses or tenderness,  no skin retraction, no nipple inversion, no nipple discharge, no skin discoloration, no axillary or supraclavicular lymphadenopathy Abdomen: no palpable masses or tenderness, no rebound or guarding Extremities: no edema or skin discoloration or tenderness  Pelvic: Vulva: Normal             Vagina: No gross lesions or discharge  Cervix/Uterus absent  Adnexa  Without masses or tenderness  Anus: Normal   Assessment/Plan:  52 y.o. female for annual exam   1. Well female exam with routine gynecological exam S/P Total Laparoscopic Hysterectomy in 2011.  Postmenopause, well on no HRT.  No pelvic pain.  Vulva normal.  Dx of Lichen sclerosus of the vulva confirmed by Bx in 02/2019.  Started on Clobetasol  at that time, but not using anymore.  No pain with IC. Pap Neg 04/2018. Urine/BMs normal. Colono 2021 or 2022.  Breasts normal.  Mammo Neg 08/2022. BMI 26.33.  Good fitness.  Recommend establishing with a Fam MD.  2. H/O total hysterectomy  3. Postmenopause S/P Total Laparoscopic Hysterectomy in 2011.  Postmenopause, well on no HRT.  No pelvic pain.  Other orders - S-Adenosylmethionine (SAM-E PO); Take by mouth. - MAGNESIUM PO; Take by mouth. - UNABLE TO FIND; Med Name: b vitamin po   Genia Del MD, 11:33 AM

## 2023-01-28 ENCOUNTER — Encounter: Payer: Self-pay | Admitting: Family Medicine

## 2023-01-28 NOTE — Telephone Encounter (Signed)
Pt on the wait list

## 2023-01-30 ENCOUNTER — Other Ambulatory Visit: Payer: Self-pay

## 2023-01-30 ENCOUNTER — Ambulatory Visit: Payer: BC Managed Care – PPO | Admitting: Family Medicine

## 2023-01-30 ENCOUNTER — Encounter: Payer: Self-pay | Admitting: Family Medicine

## 2023-01-30 VITALS — BP 106/70 | HR 75 | Ht 64.0 in | Wt 158.0 lb

## 2023-01-30 DIAGNOSIS — S83242A Other tear of medial meniscus, current injury, left knee, initial encounter: Secondary | ICD-10-CM

## 2023-01-30 DIAGNOSIS — M25562 Pain in left knee: Secondary | ICD-10-CM | POA: Diagnosis not present

## 2023-01-30 MED ORDER — MELOXICAM 15 MG PO TABS: 15.0000 mg | ORAL_TABLET | Freq: Every day | ORAL | 0 refills | Status: DC

## 2023-01-30 NOTE — Patient Instructions (Addendum)
Meloxicam 15mg  Do prescribed exercises at least 3x a week Ice and voltaren Avoid twisting motions See you again in 5-6 weeks

## 2023-01-30 NOTE — Assessment & Plan Note (Signed)
Acute medial meniscal tear noted.  Discussed with patient about different treatment options.  Patient would like to try conservative therapy with home exercises, anti-inflammatories, topical anti-inflammatories as well and icing regimen.  Discussed avoiding twisting motions.  Increase activity slowly.  Follow-up again in 6 weeks.  Worsening pain consider formal physical therapy and injections

## 2023-01-30 NOTE — Progress Notes (Signed)
[name] [address] [place] [address] Phone: [phone] Subjective:   INadine Counts, am serving as a scribe for Dr. Antoine Primas.  I'm seeing this patient by the request  of:  Patient, No Pcp Per  CC: knee pain   [id number]  Kristin Cain is a 52 y.o. female coming in with complaint of knee pain. Pain started about 3 weeks ago in L knee. NO MOI. Feels like some catching. Did ice because knee was swollen.      Past Medical History:  Diagnosis Date   Lichen sclerosus 02/2019   Biopsy-proven   Scabies    Seasonal allergies    Past Surgical History:  Procedure Laterality Date   APPENDECTOMY     CERVICAL BIOPSY  W/ LOOP ELECTRODE EXCISION     CESAREAN SECTION     x 2 BTL   COLPOSCOPY     ENDOMETRIAL ABLATION  2007   LAPAROSCOPIC TOTAL HYSTERECTOMY  03/2010   PELVIC LAPAROSCOPY     WISDOM TOOTH EXTRACTION     Social History   Socioeconomic History   Marital status: Married    Spouse name: Not on file   Number of children: Not on file   Years of education: Not on file   Highest education level: Not on file  Occupational History   Not on file  Tobacco Use   Smoking status: Never   Smokeless tobacco: Never  Vaping Use   Vaping status: Never Used  Substance and Sexual Activity   Alcohol use: Yes    Comment: OCC   Drug use: No   Sexual activity: Yes    Birth control/protection: Surgical    Comment: HYST-1st intercourse 15 yo--5 partners, MARRIED- 10 YRS  Other Topics Concern   Not on file  Social History Narrative   Not on file   Social Determinants of Health   Financial Resource Strain: Not on file  Food Insecurity: Not on file  Transportation Needs: Not on file  Physical Activity: Not on file  Stress: Not on file  Social Connections: Not on file   Allergies  Allergen Reactions   Morphine And Codeine Itching   Family History  Problem Relation Age of Onset   Diabetes Maternal Grandmother     Hyperlipidemia Mother    Hypertension Mother    Hyperlipidemia Father    Hypertension Father    Breast cancer Neg Hx        Current Outpatient Medications (Analgesics):    meloxicam (MOBIC) 15 MG tablet, Take 1 tablet (15 mg total) by mouth daily.   Current Outpatient Medications (Other):    Cholecalciferol (VITAMIN D PO), Take by mouth.   MAGNESIUM PO, Take by mouth.   Probiotic Product (PROBIOTIC PO), Take by mouth.   S-Adenosylmethionine (SAM-E PO), Take by mouth.   UNABLE TO FIND, Med Name: b vitamin po   Reviewed prior external information including notes and imaging from  primary care provider As well as notes that were available from care everywhere and other healthcare systems.  Past medical history, social, surgical and family history all reviewed in electronic medical record.  No pertanent information unless stated regarding to the chief complaint.   Review of Systems:  No headache, visual changes, nausea, vomiting, diarrhea, constipation, dizziness, abdominal pain, skin rash, fevers, chills, night sweats, weight loss, swollen lymph nodes, body aches, joint swelling, chest pain, shortness of breath, mood changes. POSITIVE muscle aches  Objective  Blood pressure 106/70, pulse  75, height 5\' 4"  (1.626 m), weight 158 lb (71.7 kg), SpO2 96%.   General: No apparent distress alert and oriented x3 mood and affect normal, dressed appropriately.  HEENT: Pupils equal, extraocular movements intact  Respiratory: Patient's speak in full sentences and does not appear short of breath  Cardiovascular: No lower extremity edema, non tender, no erythema  Left knee  exam shows patient does have some tenderness to palpation over the medial joint space.  Some positive Murray's.  No significant no instability noted.  Limited muscular skeletal ultrasound was performed and interpreted by Antoine Primas, M  Limited ultrasound shows some hypoechoic changes noted.  Seems to have some  abnormalities noted of the medial meniscus.  No significant displacement noted.  Trace hypoechoic changes noted in the patellofemoral joint Impression: Acute medial meniscal tear   Impression and Recommendations:     The above documentation has been reviewed and is accurate and complete Judi Saa, DO

## 2023-03-10 ENCOUNTER — Ambulatory Visit: Payer: BC Managed Care – PPO | Admitting: Family Medicine

## 2023-03-13 ENCOUNTER — Ambulatory Visit: Payer: BC Managed Care – PPO | Admitting: Family Medicine

## 2023-03-13 NOTE — Progress Notes (Signed)
[facility] [address] [place] [address] Phone: [phone] Subjective:    I'm seeing this patient by the request  of:  Patient, No Pcp Per  CC: Knee pain follow-up  [id number]  01/30/2023 Acute medial meniscal tear noted.  Discussed with patient about different treatment options.  Patient would like to try conservative therapy with home exercises, anti-inflammatories, topical anti-inflammatories as well and icing regimen.  Discussed avoiding twisting motions.  Increase activity slowly.  Follow-up again in 6 weeks.  Worsening pain consider formal physical therapy and injections     Updated 03/18/2023 Kristin Cain is a 52 y.o. female coming in with complaint of L knee pain . Patient states she was feeling better . She swept her deck and was in pain flare 4 weeks ago. Hasn't had to use meloxicam in a couple of weeks. States she is at time swollen and sore        Past Medical History:  Diagnosis Date   Lichen sclerosus 02/2019   Biopsy-proven   Scabies    Seasonal allergies    Past Surgical History:  Procedure Laterality Date   APPENDECTOMY     CERVICAL BIOPSY  W/ LOOP ELECTRODE EXCISION     CESAREAN SECTION     x 2 BTL   COLPOSCOPY     ENDOMETRIAL ABLATION  2007   LAPAROSCOPIC TOTAL HYSTERECTOMY  03/2010   PELVIC LAPAROSCOPY     WISDOM TOOTH EXTRACTION     Social History   Socioeconomic History   Marital status: Married    Spouse name: Not on file   Number of children: Not on file   Years of education: Not on file   Highest education level: Not on file  Occupational History   Not on file  Tobacco Use   Smoking status: Never   Smokeless tobacco: Never  Vaping Use   Vaping status: Never Used  Substance and Sexual Activity   Alcohol use: Yes    Comment: OCC   Drug use: No   Sexual activity: Yes    Birth control/protection: Surgical    Comment: HYST-1st intercourse 15 yo--5 partners, MARRIED- 45 YRS  Other  Topics Concern   Not on file  Social History Narrative   Not on file   Social Determinants of Health   Financial Resource Strain: Not on file  Food Insecurity: Not on file  Transportation Needs: Not on file  Physical Activity: Not on file  Stress: Not on file  Social Connections: Not on file   Allergies  Allergen Reactions   Morphine And Codeine Itching   Family History  Problem Relation Age of Onset   Diabetes Maternal Grandmother    Hyperlipidemia Mother    Hypertension Mother    Hyperlipidemia Father    Hypertension Father    Breast cancer Neg Hx        Current Outpatient Medications (Analgesics):    meloxicam (MOBIC) 15 MG tablet, Take 1 tablet (15 mg total) by mouth daily.   Current Outpatient Medications (Other):    Cholecalciferol (VITAMIN D PO), Take by mouth.   MAGNESIUM PO, Take by mouth.   Probiotic Product (PROBIOTIC PO), Take by mouth.   S-Adenosylmethionine (SAM-E PO), Take by mouth.   UNABLE TO FIND, Med Name: b vitamin po   Reviewed prior external information including notes and imaging from  primary care provider As well as notes that were available from care everywhere and other healthcare systems.  Past medical history,  social, surgical and family history all reviewed in electronic medical record.  No pertanent information unless stated regarding to the chief complaint.   Review of Systems:  No headache, visual changes, nausea, vomiting, diarrhea, constipation, dizziness, abdominal pain, skin rash, fevers, chills, night sweats, weight loss, swollen lymph nodes, body aches, joint swelling, chest pain, shortness of breath, mood changes. POSITIVE muscle aches  Objective  Pulse 64, height 5\' 4"  (1.626 m), weight 159 lb (72.1 kg), SpO2 98%.   General: No apparent distress alert and oriented x3 mood and affect normal, dressed appropriately.  HEENT: Pupils equal, extraocular movements intact  Respiratory: Patient's speak in full sentences and does  not appear short of breath  Cardiovascular: No lower extremity edema, non tender, no erythema  Left knee exam shows the patient does have some tenderness to palpation noted.  Mild positive pain with McMurray still noted.  Limited muscular skeletal ultrasound was performed and interpreted by Antoine Primas, M  Limited ultrasound shows some mild hypoechoic changes noted.  This is in the patellofemoral joint.  Patient does have what appears to be some scar tissue formation of the medial meniscus.  No significant displacement noted. Impression: Interval improvement    Impression and Recommendations:     The above documentation has been reviewed and is accurate and complete Judi Saa, DO

## 2023-03-18 ENCOUNTER — Other Ambulatory Visit: Payer: Self-pay

## 2023-03-18 ENCOUNTER — Encounter: Payer: Self-pay | Admitting: Family Medicine

## 2023-03-18 ENCOUNTER — Ambulatory Visit: Payer: BC Managed Care – PPO | Admitting: Family Medicine

## 2023-03-18 VITALS — HR 64 | Ht 64.0 in | Wt 159.0 lb

## 2023-03-18 DIAGNOSIS — M25562 Pain in left knee: Secondary | ICD-10-CM

## 2023-03-18 DIAGNOSIS — S83242A Other tear of medial meniscus, current injury, left knee, initial encounter: Secondary | ICD-10-CM | POA: Diagnosis not present

## 2023-03-18 NOTE — Patient Instructions (Signed)
Can do twisting after 1st of year Kristin Cain See me again in 6 weeks if not perfect

## 2023-03-24 ENCOUNTER — Encounter: Payer: Self-pay | Admitting: Obstetrics and Gynecology

## 2023-03-24 ENCOUNTER — Ambulatory Visit (INDEPENDENT_AMBULATORY_CARE_PROVIDER_SITE_OTHER): Payer: BC Managed Care – PPO | Admitting: Obstetrics and Gynecology

## 2023-03-24 VITALS — BP 112/68 | HR 75 | Wt 156.0 lb

## 2023-03-24 DIAGNOSIS — L292 Pruritus vulvae: Secondary | ICD-10-CM

## 2023-03-24 DIAGNOSIS — L900 Lichen sclerosus et atrophicus: Secondary | ICD-10-CM | POA: Diagnosis not present

## 2023-03-24 LAB — WET PREP FOR TRICH, YEAST, CLUE

## 2023-03-24 MED ORDER — CLOBETASOL PROPIONATE 0.05 % EX OINT: 1.0000 | TOPICAL_OINTMENT | Freq: Every day | CUTANEOUS | 2 refills | Status: AC

## 2023-03-24 NOTE — Assessment & Plan Note (Signed)
Vulva inflammed with mucosal sloughing Unclear etiology, wet prep normal Will send out yeast culture to r/o superimposed infection Discussed viral infections can have mucocutaneous involvement Recommend every day clobetaol for 6wk with follow-up at that time Given hx of biopsy proven LS, recommend tapering to twice weekly if exam is improved If not patient may need vaginal hydrocortisone

## 2023-03-24 NOTE — Patient Instructions (Signed)
Consider applying aquaphor or vaseline as a barrier ointment during the day.

## 2023-03-24 NOTE — Progress Notes (Signed)
52 y.o. G2P2 female s/p TLH with LS here for vaginal complaints.  No LMP recorded. Patient has had a hysterectomy.  Pt c/o vaginal irritation and itching x one week. No discharge or odor. However sx have been on and off for years. Improved with both vaginal estrogen and clobetasol but never refilled medications. Has had some congestion for the past week. Denies fever.   GYN HISTORY: No significant history  OB History  Gravida Para Term Preterm AB Living  2 2    3   SAB IAB Ectopic Multiple Live Births     1     # Outcome Date GA Lbr Len/2nd Weight Sex Type Anes PTL Lv  2 Para           1 Para             Past Medical History:  Diagnosis Date   Lichen sclerosus 02/2019   Biopsy-proven   Scabies    Seasonal allergies     Past Surgical History:  Procedure Laterality Date   APPENDECTOMY     CERVICAL BIOPSY  W/ LOOP ELECTRODE EXCISION     CESAREAN SECTION     x 2 BTL   COLPOSCOPY     ENDOMETRIAL ABLATION  2007   LAPAROSCOPIC TOTAL HYSTERECTOMY  03/2010   PELVIC LAPAROSCOPY     WISDOM TOOTH EXTRACTION      Current Outpatient Medications on File Prior to Visit  Medication Sig Dispense Refill   Cholecalciferol (VITAMIN D PO) Take by mouth.     MAGNESIUM PO Take by mouth.     Probiotic Product (PROBIOTIC PO) Take by mouth.     S-Adenosylmethionine (SAM-E PO) Take by mouth.     UNABLE TO FIND Med Name: b vitamin po     No current facility-administered medications on file prior to visit.    Allergies  Allergen Reactions   Morphine And Codeine Itching      PE Today's Vitals   03/24/23 1400  BP: 112/68  Pulse: 75  SpO2: 97%  Weight: 156 lb (70.8 kg)   Body mass index is 26.78 kg/m.  Physical Exam Vitals reviewed. Exam conducted with a chaperone present.  Constitutional:      General: She is not in acute distress.    Appearance: Normal appearance.  HENT:     Head: Normocephalic and atraumatic.     Nose: Nose normal.  Eyes:     Extraocular  Movements: Extraocular movements intact.     Conjunctiva/sclera: Conjunctivae normal.  Pulmonary:     Effort: Pulmonary effort is normal.  Genitourinary:    General: Normal vulva.     Exam position: Lithotomy position.     Vagina: Normal. No vaginal discharge.     Uterus: Absent.      Adnexa: Right adnexa normal and left adnexa normal.     Comments: Vulva +erythema with skin sloughing Cervix and uterus absent. Musculoskeletal:        General: Normal range of motion.     Cervical back: Normal range of motion.  Neurological:     General: No focal deficit present.     Mental Status: She is alert.  Psychiatric:        Mood and Affect: Mood normal.        Behavior: Behavior normal.       Assessment and Plan:        Vulvar itching -     WET PREP FOR TRICH, YEAST, CLUE -  Clobetasol Propionate; Apply 1 Application topically daily. After 6 weeks of use, decrease to twice a week application to the vulva.  Dispense: 30 g; Refill: 2 -     SureSwab Advanced Candida Vaginitis (CV), TMA  Lichen sclerosus et atrophicus Assessment & Plan: Vulva inflammed with mucosal sloughing Unclear etiology, wet prep normal Will send out yeast culture to r/o superimposed infection Discussed viral infections can have mucocutaneous involvement Recommend every day clobetaol for 6wk with follow-up at that time Given hx of biopsy proven LS, recommend tapering to twice weekly if exam is improved If not patient may need vaginal hydrocortisone  Orders: -     Clobetasol Propionate; Apply 1 Application topically daily. After 6 weeks of use, decrease to twice a week application to the vulva.  Dispense: 30 g; Refill: 2     Rosalyn Gess, MD

## 2023-03-25 LAB — SURESWAB® ADVANCED CANDIDA VAGINITIS (CV), TMA
CANDIDA SPECIES: NOT DETECTED
Candida glabrata: NOT DETECTED

## 2023-05-02 ENCOUNTER — Other Ambulatory Visit: Payer: Self-pay

## 2023-05-02 ENCOUNTER — Ambulatory Visit (INDEPENDENT_AMBULATORY_CARE_PROVIDER_SITE_OTHER): Payer: BC Managed Care – PPO | Admitting: Family Medicine

## 2023-05-02 VITALS — BP 112/84 | HR 75 | Ht 64.0 in | Wt 157.0 lb

## 2023-05-02 DIAGNOSIS — M25562 Pain in left knee: Secondary | ICD-10-CM

## 2023-05-02 DIAGNOSIS — S83242A Other tear of medial meniscus, current injury, left knee, initial encounter: Secondary | ICD-10-CM

## 2023-05-02 DIAGNOSIS — M79641 Pain in right hand: Secondary | ICD-10-CM

## 2023-05-02 NOTE — Progress Notes (Unsigned)
[name] [address] [place] [address] Phone: [phone] Subjective:   INadine Counts, am serving as a scribe for Dr. Antoine Primas.  I'm seeing this patient by the request  of:  Patient, No Pcp Per  CC: Left knee pain follow-up  BJY:[id number]  Kristin Cain is a 53 y.o. female coming in with complaint of L knee pain. Found to have meniscal tear in December 2024. Patient states pain isn't that bad. Started working out again, not catching anmore, but can feel it. -     Past Medical History:  Diagnosis Date   Lichen sclerosus 02/2019   Biopsy-proven   Scabies    Seasonal allergies    Past Surgical History:  Procedure Laterality Date   APPENDECTOMY     CERVICAL BIOPSY  W/ LOOP ELECTRODE EXCISION     CESAREAN SECTION     x 2 BTL   COLPOSCOPY     ENDOMETRIAL ABLATION  2007   LAPAROSCOPIC TOTAL HYSTERECTOMY  03/2010   PELVIC LAPAROSCOPY     WISDOM TOOTH EXTRACTION     Social History   Socioeconomic History   Marital status: Married    Spouse name: Not on file   Number of children: Not on file   Years of education: Not on file   Highest education level: Not on file  Occupational History   Not on file  Tobacco Use   Smoking status: Never   Smokeless tobacco: Never  Vaping Use   Vaping status: Never Used  Substance and Sexual Activity   Alcohol use: Yes    Comment: OCC   Drug use: No   Sexual activity: Yes    Birth control/protection: Surgical    Comment: HYST-1st intercourse 15 yo--5 partners, MARRIED- 10 YRS  Other Topics Concern   Not on file  Social History Narrative   Not on file   Social Drivers of Health   Financial Resource Strain: Not on file  Food Insecurity: Not on file  Transportation Needs: Not on file  Physical Activity: Not on file  Stress: Not on file  Social Connections: Not on file   Allergies  Allergen Reactions   Morphine And Codeine Itching   Family History  Problem Relation Age  of Onset   Diabetes Maternal Grandmother    Hyperlipidemia Mother    Hypertension Mother    Hyperlipidemia Father    Hypertension Father    Breast cancer Neg Hx          Current Outpatient Medications (Other):    Cholecalciferol (VITAMIN D PO), Take by mouth.   clobetasol ointment (TEMOVATE) 0.05 %, Apply 1 Application topically daily. After 6 weeks of use, decrease to twice a week application to the vulva.   MAGNESIUM PO, Take by mouth.   Probiotic Product (PROBIOTIC PO), Take by mouth.   S-Adenosylmethionine (SAM-E PO), Take by mouth.   UNABLE TO FIND, Med Name: b vitamin po   Reviewed prior external information including notes and imaging from  primary care provider As well as notes that were available from care everywhere and other healthcare systems.  Past medical history, social, surgical and family history all reviewed in electronic medical record.  No pertanent information unless stated regarding to the chief complaint.   Review of Systems:  No headache, visual changes, nausea, vomiting, diarrhea, constipation, dizziness, abdominal pain, skin rash, fevers, chills, night sweats, weight loss, swollen lymph nodes, body aches, joint swelling, chest pain, shortness of breath, mood  changes. POSITIVE muscle aches  Objective  Blood pressure 112/84, pulse 75, height 5\' 4"  (1.626 m), weight 157 lb (71.2 kg), SpO2 97%.   General: No apparent distress alert and oriented x3 mood and affect normal, dressed appropriately.  HEENT: Pupils equal, extraocular movements intact  Respiratory: Patient's speak in full sentences and does not appear short of breath  Cardiovascular: No lower extremity edema, non tender, no erythema      Impression and Recommendations:

## 2023-05-02 NOTE — Assessment & Plan Note (Signed)
Appears well-healed at this time.  Nothing that should be stopping her from activity.  We discussed with patient to increase activity as tolerated.  Any worsening symptoms again changing would be warranted.  Patient can follow-up on an as needed basis.

## 2023-05-02 NOTE — Patient Instructions (Signed)
Knee looks great Good to go See you again in 2 months

## 2023-05-13 ENCOUNTER — Encounter: Payer: Self-pay | Admitting: Family Medicine

## 2023-05-19 ENCOUNTER — Ambulatory Visit: Payer: BC Managed Care – PPO | Admitting: Obstetrics and Gynecology

## 2023-06-26 NOTE — Progress Notes (Signed)
 [name] [address] [place] [address] Phone: [phone] Subjective:   Bruce Donath, am serving as a scribe for Dr. Antoine Primas.  I'm seeing this patient by the request  of:  Patient, No Pcp Per  CC: Left knee pain, hand pain,  BJY:NWGNFAOZHY  05/02/2023 Appears well-healed at this time.  Nothing that should be stopping her from activity.  We discussed with patient to increase activity as tolerated.  Any worsening symptoms again changing would be warranted.  Patient can follow-up on an as needed basis.      Update 06/30/2023 Kristin Cain is a 53 y.o. female coming in with complaint of L knee pain. Patient states that she was feeling good. Had some clicking and did some squats on Saturday and jammed her toe on some furniture. Unsure if she twisted her knee when she jammed toe or id something when squatting. Has been able to do reverse lunges without pain.   Also having clicking in the R knee.        Past Medical History:  Diagnosis Date   Lichen sclerosus 02/2019   Biopsy-proven   Scabies    Seasonal allergies    Past Surgical History:  Procedure Laterality Date   APPENDECTOMY     CERVICAL BIOPSY  W/ LOOP ELECTRODE EXCISION     CESAREAN SECTION     x 2 BTL   COLPOSCOPY     ENDOMETRIAL ABLATION  2007   LAPAROSCOPIC TOTAL HYSTERECTOMY  03/2010   PELVIC LAPAROSCOPY     WISDOM TOOTH EXTRACTION     Social History   Socioeconomic History   Marital status: Married    Spouse name: Not on file   Number of children: Not on file   Years of education: Not on file   Highest education level: Not on file  Occupational History   Not on file  Tobacco Use   Smoking status: Never   Smokeless tobacco: Never  Vaping Use   Vaping status: Never Used  Substance and Sexual Activity   Alcohol use: Yes    Comment: OCC   Drug use: No   Sexual activity: Yes    Birth control/protection: Surgical    Comment: HYST-1st intercourse  15 yo--5 partners, MARRIED- 12 YRS  Other Topics Concern   Not on file  Social History Narrative   Not on file   Social Drivers of Health   Financial Resource Strain: Not on file  Food Insecurity: Not on file  Transportation Needs: Not on file  Physical Activity: Not on file  Stress: Not on file  Social Connections: Not on file   Allergies  Allergen Reactions   Morphine And Codeine Itching   Family History  Problem Relation Age of Onset   Diabetes Maternal Grandmother    Hyperlipidemia Mother    Hypertension Mother    Hyperlipidemia Father    Hypertension Father    Breast cancer Neg Hx          Current Outpatient Medications (Other):    Cholecalciferol (VITAMIN D PO), Take by mouth.   clobetasol ointment (TEMOVATE) 0.05 %, Apply 1 Application topically daily. After 6 weeks of use, decrease to twice a week application to the vulva.   MAGNESIUM PO, Take by mouth.   Probiotic Product (PROBIOTIC PO), Take by mouth.   S-Adenosylmethionine (SAM-E PO), Take by mouth.   UNABLE TO FIND, Med Name: b vitamin po   Reviewed prior external information including notes and  imaging from  primary care provider As well as notes that were available from care everywhere and other healthcare systems.  Past medical history, social, surgical and family history all reviewed in electronic medical record.  No pertanent information unless stated regarding to the chief complaint.   Review of Systems:  No headache, visual changes, nausea, vomiting, diarrhea, constipation, dizziness, abdominal pain, skin rash, fevers, chills, night sweats, weight loss, swollen lymph nodes, body aches, joint swelling, chest pain, shortness of breath, mood changes. POSITIVE muscle aches  Objective  Blood pressure 108/78, pulse 77, height 5\' 4"  (1.626 m), SpO2 96%.   General: No apparent distress alert and oriented x3 mood and affect normal, dressed appropriately.  HEENT: Pupils equal, extraocular movements  intact  Respiratory: Patient's speak in full sentences and does not appear short of breath  Cardiovascular: No lower extremity edema, non tender, no erythema  Left knee exam shows good range of motion noted.  Some lateral tracking of the patellas bilaterally.  Negative McMurray's  Limited muscular skeletal ultrasound was performed and interpreted by Antoine Primas, M  Limited ultrasound shows tightness noted overall.  Because of the hypoechoic changes of the patellofemoral joints bilaterally.  No cortical irregularities are noted otherwise.    Impression and Recommendations:     The above documentation has been reviewed and is accurate and complete Judi Saa, DO

## 2023-06-30 ENCOUNTER — Other Ambulatory Visit: Payer: Self-pay

## 2023-06-30 ENCOUNTER — Encounter: Payer: Self-pay | Admitting: Family Medicine

## 2023-06-30 ENCOUNTER — Ambulatory Visit (INDEPENDENT_AMBULATORY_CARE_PROVIDER_SITE_OTHER): Payer: BC Managed Care – PPO | Admitting: Family Medicine

## 2023-06-30 VITALS — BP 108/78 | HR 77 | Ht 64.0 in

## 2023-06-30 DIAGNOSIS — M222X2 Patellofemoral disorders, left knee: Secondary | ICD-10-CM

## 2023-06-30 DIAGNOSIS — S83242A Other tear of medial meniscus, current injury, left knee, initial encounter: Secondary | ICD-10-CM

## 2023-06-30 DIAGNOSIS — M222X9 Patellofemoral disorders, unspecified knee: Secondary | ICD-10-CM | POA: Insufficient documentation

## 2023-06-30 DIAGNOSIS — M25562 Pain in left knee: Secondary | ICD-10-CM | POA: Diagnosis not present

## 2023-06-30 DIAGNOSIS — M222X1 Patellofemoral disorders, right knee: Secondary | ICD-10-CM | POA: Diagnosis not present

## 2023-06-30 MED ORDER — MELOXICAM 15 MG PO TABS: 15.0000 mg | ORAL_TABLET | Freq: Every day | ORAL | 0 refills | Status: AC

## 2023-06-30 NOTE — Assessment & Plan Note (Signed)
 Patellofemoral syndrome noted.  Discussed icing regimen and home exercises, which activities to do and which ones to avoid.  Increase activity slowly.

## 2023-06-30 NOTE — Patient Instructions (Signed)
 VMO hip abd See me in 2 months

## 2023-06-30 NOTE — Assessment & Plan Note (Signed)
 I believe that this is healed and does have more of the patellofemoral joint aspect to it.  Discussed with patient about icing regimen and home exercises, discussed which activities to do and which ones to avoid.  We will continue to monitor and given new exercises for the VMO.

## 2023-09-02 ENCOUNTER — Ambulatory Visit: Admitting: Family Medicine

## 2023-09-17 ENCOUNTER — Ambulatory Visit: Admitting: Family Medicine

## 2023-10-14 ENCOUNTER — Ambulatory Visit (INDEPENDENT_AMBULATORY_CARE_PROVIDER_SITE_OTHER): Admitting: Radiology

## 2023-10-14 ENCOUNTER — Encounter: Payer: Self-pay | Admitting: Radiology

## 2023-10-14 VITALS — BP 102/72 | HR 69 | Ht 64.76 in | Wt 160.6 lb

## 2023-10-14 DIAGNOSIS — Z1331 Encounter for screening for depression: Secondary | ICD-10-CM

## 2023-10-14 DIAGNOSIS — Z01419 Encounter for gynecological examination (general) (routine) without abnormal findings: Secondary | ICD-10-CM

## 2023-10-14 DIAGNOSIS — N951 Menopausal and female climacteric states: Secondary | ICD-10-CM | POA: Diagnosis not present

## 2023-10-14 DIAGNOSIS — L900 Lichen sclerosus et atrophicus: Secondary | ICD-10-CM | POA: Diagnosis not present

## 2023-10-14 MED ORDER — ESTRADIOL 0.05 MG/24HR TD PTTW: 1.0000 | MEDICATED_PATCH | TRANSDERMAL | 4 refills | Status: DC

## 2023-10-14 MED ORDER — ESTRADIOL 0.1 MG/GM VA CREA: 1.0000 g | TOPICAL_CREAM | VAGINAL | 3 refills | Status: AC

## 2023-10-14 NOTE — Patient Instructions (Signed)
 Preventive Care 16-55 Years Old, Female  Preventive care refers to lifestyle choices and visits with your health care provider that can promote health and wellness. Preventive care visits are also called wellness exams.  What can I expect for my preventive care visit?  Counseling  Your health care provider may ask you questions about your:  Medical history, including:  Past medical problems.  Family medical history.  Pregnancy history.  Current health, including:  Menstrual cycle.  Method of birth control.  Emotional well-being.  Home life and relationship well-being.  Sexual activity and sexual health.  Lifestyle, including:  Alcohol, nicotine or tobacco, and drug use.  Access to firearms.  Diet, exercise, and sleep habits.  Work and work Astronomer.  Sunscreen use.  Safety issues such as seatbelt and bike helmet use.  Physical exam  Your health care provider will check your:  Height and weight. These may be used to calculate your BMI (body mass index). BMI is a measurement that tells if you are at a healthy weight.  Waist circumference. This measures the distance around your waistline. This measurement also tells if you are at a healthy weight and may help predict your risk of certain diseases, such as type 2 diabetes and high blood pressure.  Heart rate and blood pressure.  Body temperature.  Skin for abnormal spots.  What immunizations do I need?    Vaccines are usually given at various ages, according to a schedule. Your health care provider will recommend vaccines for you based on your age, medical history, and lifestyle or other factors, such as travel or where you work.  What tests do I need?  Screening  Your health care provider may recommend screening tests for certain conditions. This may include:  Lipid and cholesterol levels.  Diabetes screening. This is done by checking your blood sugar (glucose) after you have not eaten for a while (fasting).  Pelvic exam and Pap test.  Hepatitis B test.  Hepatitis C  test.  HIV (human immunodeficiency virus) test.  STI (sexually transmitted infection) testing, if you are at risk.  Lung cancer screening.  Colorectal cancer screening.  Mammogram. Talk with your health care provider about when you should start having regular mammograms. This may depend on whether you have a family history of breast cancer.  BRCA-related cancer screening. This may be done if you have a family history of breast, ovarian, tubal, or peritoneal cancers.  Bone density scan. This is done to screen for osteoporosis.  Talk with your health care provider about your test results, treatment options, and if necessary, the need for more tests.  Follow these instructions at home:  Eating and drinking    Eat a diet that includes fresh fruits and vegetables, whole grains, lean protein, and low-fat dairy products.  Take vitamin and mineral supplements as recommended by your health care provider.  Do not drink alcohol if:  Your health care provider tells you not to drink.  You are pregnant, may be pregnant, or are planning to become pregnant.  If you drink alcohol:  Limit how much you have to 0-1 drink a day.  Know how much alcohol is in your drink. In the [place] one drink equals one 12 oz bottle of beer (355 mL), one 5 oz glass of wine (148 mL), or one 1 oz glass of hard liquor (44 mL).  Lifestyle  Brush your teeth every morning and night with fluoride toothpaste. Floss one time each day.  Exercise for at least  30 minutes 5 or more days each week.  Do not use any products that contain nicotine or tobacco. These products include cigarettes, chewing tobacco, and vaping devices, such as e-cigarettes. If you need help quitting, ask your health care provider.  Do not use drugs.  If you are sexually active, practice safe sex. Use a condom or other form of protection to prevent STIs.  If you do not wish to become pregnant, use a form of birth control. If you plan to become pregnant, see your health care provider for a  prepregnancy visit.  Take aspirin only as told by your health care provider. Make sure that you understand how much to take and what form to take. Work with your health care provider to find out whether it is safe and beneficial for you to take aspirin daily.  Find healthy ways to manage stress, such as:  Meditation, yoga, or listening to music.  Journaling.  Talking to a trusted person.  Spending time with friends and family.  Minimize exposure to UV radiation to reduce your risk of skin cancer.  Safety  Always wear your seat belt while driving or riding in a vehicle.  Do not drive:  If you have been drinking alcohol. Do not ride with someone who has been drinking.  When you are tired or distracted.  While texting.  If you have been using any mind-altering substances or drugs.  Wear a helmet and other protective equipment during sports activities.  If you have firearms in your house, make sure you follow all gun safety procedures.  Seek help if you have been physically or sexually abused.  What's next?  Visit your health care provider once a year for an annual wellness visit.  Ask your health care provider how often you should have your eyes and teeth checked.  Stay up to date on all vaccines.  This information is not intended to replace advice given to you by your health care provider. Make sure you discuss any questions you have with your health care provider.  Document Revised: 09/20/2020 Document Reviewed: 09/20/2020  Elsevier Patient Education  2024 [employer].

## 2023-10-14 NOTE — Progress Notes (Signed)
   Kristin Cain [birth year] [id number]   History:  53 y.o. G2P2 presents for annual exam. Hx of lichen sclerosus diagnosed by biopsy 2020, uses clobetasol  prn. Has not used vaginal estrogen for it in years. C/o some joint pain, no hot flashes or night sweats, sleeping well. Open to benefits of HRT. Has PCP appt in October. Followed by [facility] as well. Exercises multiple times a week.  Gynecologic History Hysterectomy: endometriosis 2011  Sexually active: yes  Health Maintenance Last Pap: 2020. Results were: normal Last mammogram: 5/24. Results were: normal Last colonoscopy: within the last 10 years, GI told her she wasn't due when she called       10/14/2023    1:27 PM  Depression screen PHQ 2/9  Decreased Interest 0  Down, Depressed, Hopeless 0  PHQ - 2 Score 0     Past medical history, past surgical history, family history and social history were all reviewed and documented in the EPIC chart.  ROS:  A ROS was performed and pertinent positives and negatives are included.  Exam:  Vitals:   10/14/23 1327  BP: 102/72  Pulse: 69  SpO2: 98%  Weight: 160 lb 9.6 oz (72.8 kg)  Height: 5' 4.76 (1.645 m)   Body mass index is 26.92 kg/m.  General appearance:  Normal Thyroid:  Symmetrical, normal in size, without palpable masses or nodularity. Respiratory  Auscultation:  Clear without wheezing or rhonchi Cardiovascular  Auscultation:  Regular rate, without rubs, murmurs or gallops  Edema/varicosities:  Not grossly evident Abdominal  Soft,nontender, without masses, guarding or rebound.  Liver/spleen:  No organomegaly noted  Hernia:  None appreciated  Skin  Inspection:  Grossly normal Breasts: Examined lying and sitting.   Right: Without masses, retractions, nipple discharge or axillary adenopathy.   Left: Without masses, retractions, nipple discharge or axillary adenopathy. Genitourinary   Inguinal/mons:  Normal without inguinal adenopathy  External  genitalia:  Normal appearing vulva with no masses, tenderness, or lesions  BUS/Urethra/Skene's glands:  Normal  Vagina:  Normal appearing with normal color and discharge, no lesions. Atrophy moderate  Cervix:  absent  Uterus:  absent  Adnexa/parametria:     Rt: Normal in size, without masses or tenderness.   Lt: Normal in size, without masses or tenderness.  Anus and perineum: Normal   Darice Hoit, CMA present for exam  Assessment/Plan:   1. Well woman exam with routine gynecological exam (Primary) Schedule mammogram  2. Lichen sclerosus et atrophicus - estradiol  (ESTRACE  VAGINAL) 0.1 MG/GM vaginal cream; Place 1 g vaginally 3 (three) times a week.  Dispense: 42.5 g; Refill: 3  3. Depression screening negative  4. Menopausal symptoms - estradiol  (VIVELLE -DOT) 0.05 MG/24HR patch; Place 1 patch (0.05 mg total) onto the skin 2 (two) times a week.  Dispense: 24 patch; Refill: 4     Return in 1 year for annual or sooner prn.  GINETTE COZIER B WHNP-BC 1:37 PM 10/14/2023

## 2023-11-13 NOTE — Progress Notes (Unsigned)
 [facility] [address] [place] [address] Phone: [phone] Subjective:   Kristin Cain Kristin Cain, am serving as a scribe for Dr. Arthea Claudene.  I'm seeing this patient by the request  of:  Patient, No Pcp Per  CC: Left knee pain and swelling  YEP:Dlagzrupcz  06/30/2023 Patellofemoral syndrome noted.  Discussed icing regimen and home exercises, which activities to do and which ones to avoid.  Increase activity slowly.     I believe that this is healed and does have more of the patellofemoral joint aspect to it.  Discussed with patient about icing regimen and home exercises, discussed which activities to do and which ones to avoid.  We will continue to monitor and given new exercises for the VMO.     Updated 11/14/2023 Kristin Cain is a 53 y.o. female coming in with complaint of L knee pain. She had swelling and pain in back of her knee a week prior to the workout where thought she injured her knee. Pain has been progressing. Had some locking in the medial aspect of joint. Pain in the distal ITB. Has been using meloxicam  for past week. Most of her pain is in posterior aspect knee.        Past Medical History:  Diagnosis Date   Lichen sclerosus 02/2019   Biopsy-proven   Scabies    Seasonal allergies    Past Surgical History:  Procedure Laterality Date   APPENDECTOMY     CERVICAL BIOPSY  W/ LOOP ELECTRODE EXCISION     CESAREAN SECTION     x 2 BTL   COLPOSCOPY     ENDOMETRIAL ABLATION  2007   LAPAROSCOPIC TOTAL HYSTERECTOMY  03/2010   PELVIC LAPAROSCOPY     WISDOM TOOTH EXTRACTION     Social History   Socioeconomic History   Marital status: Married    Spouse name: Not on file   Number of children: Not on file   Years of education: Not on file   Highest education level: Not on file  Occupational History   Not on file  Tobacco Use   Smoking status: Never    Passive exposure: Never   Smokeless tobacco: Never  Vaping Use    Vaping status: Never Used  Substance and Sexual Activity   Alcohol use: Yes    Comment: 1-2 times a month   Drug use: No   Sexual activity: Yes    Birth control/protection: Surgical    Comment: HYST-1st intercourse 15 yo--5 partners, MARRIED- 3 YRS  Other Topics Concern   Not on file  Social History Narrative   Not on file   Social Drivers of Health   Financial Resource Strain: Not on file  Food Insecurity: Not on file  Transportation Needs: Not on file  Physical Activity: Not on file  Stress: Not on file  Social Connections: Not on file   Allergies  Allergen Reactions   Morphine And Codeine Itching   Family History  Problem Relation Age of Onset   Diabetes Maternal Grandmother    Hyperlipidemia Mother    Hypertension Mother    Hyperlipidemia Father    Hypertension Father    Breast cancer Neg Hx     Current Outpatient Medications (Endocrine & Metabolic):    estradiol  (VIVELLE -DOT) 0.05 MG/24HR patch, Place 1 patch (0.05 mg total) onto the skin 2 (two) times a week.    Current Outpatient Medications (Analgesics):    meloxicam  (MOBIC ) 15 MG tablet, Take 1  tablet (15 mg total) by mouth daily.   Current Outpatient Medications (Other):    Cholecalciferol (VITAMIN D  PO), Take by mouth.   clobetasol  ointment (TEMOVATE ) 0.05 %, Apply 1 Application topically daily. After 6 weeks of use, decrease to twice a week application to the vulva.   estradiol  (ESTRACE  VAGINAL) 0.1 MG/GM vaginal cream, Place 1 g vaginally 3 (three) times a week.   MAGNESIUM PO, Take by mouth.   Probiotic Product (PROBIOTIC PO), Take by mouth.   S-Adenosylmethionine (SAM-E PO), Take by mouth.   UNABLE TO FIND, Med Name: b vitamin po   Reviewed prior external information including notes and imaging from  primary care provider As well as notes that were available from care everywhere and other healthcare systems.  Past medical history, social, surgical and family history all reviewed in electronic  medical record.  No pertanent information unless stated regarding to the chief complaint.   Review of Systems:  No headache, visual changes, nausea, vomiting, diarrhea, constipation, dizziness, abdominal pain, skin rash, fevers, chills, night sweats, weight loss, swollen lymph nodes, body aches, joint swelling, chest pain, shortness of breath, mood changes. POSITIVE muscle aches  Objective  Blood pressure 110/78, pulse 79, height 5' 4 (1.626 m), weight 161 lb (73 kg), SpO2 97%.   General: No apparent distress alert and oriented x3 mood and affect normal, dressed appropriately.  HEENT: Pupils equal, extraocular movements intact  Respiratory: Patient's speak in full sentences and does not appear short of breath  Cardiovascular: No lower extremity edema, non tender, no erythema  Left knee exam shows patient does have some instability noted with varus and valgus force as well as the anterior drawer test that does appear to be fairly different than previous exam.  Only trace amount of effusion noted though.   Limited muscular skeletal ultrasound was performed and interpreted by CLAUDENE HUSSAR, M  Limited ultrasound shows some hypoechoic changes in the patellofemoral area.  No new meniscal injury appreciated.  No significant swelling in the popliteal area   Impression and Recommendations:    The above documentation has been reviewed and is accurate and complete [facility] M [name], DO

## 2023-11-14 ENCOUNTER — Other Ambulatory Visit: Payer: Self-pay

## 2023-11-14 ENCOUNTER — Ambulatory Visit (INDEPENDENT_AMBULATORY_CARE_PROVIDER_SITE_OTHER): Admitting: Family Medicine

## 2023-11-14 ENCOUNTER — Encounter: Payer: Self-pay | Admitting: Family Medicine

## 2023-11-14 VITALS — BP 110/78 | HR 79 | Ht 64.0 in | Wt 161.0 lb

## 2023-11-14 DIAGNOSIS — M25362 Other instability, left knee: Secondary | ICD-10-CM | POA: Diagnosis not present

## 2023-11-14 DIAGNOSIS — M25562 Pain in left knee: Secondary | ICD-10-CM | POA: Diagnosis not present

## 2023-11-14 NOTE — Assessment & Plan Note (Signed)
 Discussed with patient about the instability of the left knee.  Concern for a potentially even partial tear of the ACL.  At this point a hinged brace given.  Avoid twisting and jumping mechanics.  Discussed increasing activity slowly and then following up with me again in 6 to 8 weeks.  Worsening instability advanced imaging would be warranted.  Follow-up again as stated

## 2023-11-14 NOTE — Patient Instructions (Signed)
 Hinged brace: Daily for 7-10 days W exercises for 6-8 weeks Keep up other exercises See me in 6-8 weeks

## 2023-11-25 ENCOUNTER — Ambulatory Visit: Admitting: Family Medicine

## 2023-12-15 ENCOUNTER — Ambulatory Visit: Admitting: Family Medicine

## 2023-12-18 ENCOUNTER — Other Ambulatory Visit: Payer: Self-pay

## 2023-12-18 ENCOUNTER — Encounter: Payer: Self-pay | Admitting: Family Medicine

## 2023-12-18 DIAGNOSIS — M79671 Pain in right foot: Secondary | ICD-10-CM

## 2023-12-18 DIAGNOSIS — M25571 Pain in right ankle and joints of right foot: Secondary | ICD-10-CM

## 2023-12-19 ENCOUNTER — Ambulatory Visit (INDEPENDENT_AMBULATORY_CARE_PROVIDER_SITE_OTHER)

## 2023-12-19 DIAGNOSIS — M79671 Pain in right foot: Secondary | ICD-10-CM

## 2023-12-19 DIAGNOSIS — M25571 Pain in right ankle and joints of right foot: Secondary | ICD-10-CM

## 2023-12-25 NOTE — Progress Notes (Unsigned)
 [facility] [address] [place] [address] Phone: [phone] Subjective:   Kristin Cain, am serving as a scribe for Dr. Arthea Kristin.  I'm seeing this patient by the request  of:  Patient, No Pcp Per  CC: Right foot, left knee  YEP:Dlagzrupcz  11/14/2023 Discussed with patient about the instability of the left knee.  Concern for a potentially even partial tear of the ACL.  At this point a hinged brace given.  Avoid twisting and jumping mechanics.  Discussed increasing activity slowly and then following up with me again in 6 to 8 weeks.  Worsening instability advanced imaging would be warranted.  Follow-up again as stated   Updated 12/26/2023 Kristin Cain is a 53 y.o. female coming in with complaint of R foot and L knee pain. Knee is weak. No longer painful. Swelling waxes and wanes.   Pain in R foot after rolling ankle on Wednesday. Used crutches for a few days. Painful       Past Medical History:  Diagnosis Date   Lichen sclerosus 02/2019   Biopsy-proven   Scabies    Seasonal allergies    Past Surgical History:  Procedure Laterality Date   APPENDECTOMY     CERVICAL BIOPSY  W/ LOOP ELECTRODE EXCISION     CESAREAN SECTION     x 2 BTL   COLPOSCOPY     ENDOMETRIAL ABLATION  2007   LAPAROSCOPIC TOTAL HYSTERECTOMY  03/2010   PELVIC LAPAROSCOPY     WISDOM TOOTH EXTRACTION     Social History   Socioeconomic History   Marital status: Married    Spouse name: Not on file   Number of children: Not on file   Years of education: Not on file   Highest education level: Not on file  Occupational History   Not on file  Tobacco Use   Smoking status: Never    Passive exposure: Never   Smokeless tobacco: Never  Vaping Use   Vaping status: Never Used  Substance and Sexual Activity   Alcohol use: Yes    Comment: 1-2 times a month   Drug use: No   Sexual activity: Yes    Birth control/protection: Surgical    Comment: HYST-1st  intercourse 15 yo--5 partners, MARRIED- 9 YRS  Other Topics Concern   Not on file  Social History Narrative   Not on file   Social Drivers of Health   Financial Resource Strain: Not on file  Food Insecurity: Not on file  Transportation Needs: Not on file  Physical Activity: Not on file  Stress: Not on file  Social Connections: Not on file   Allergies  Allergen Reactions   Morphine And Codeine Itching   Family History  Problem Relation Age of Onset   Diabetes Maternal Grandmother    Hyperlipidemia Mother    Hypertension Mother    Hyperlipidemia Father    Hypertension Father    Breast cancer Neg Hx     Current Outpatient Medications (Endocrine & Metabolic):    estradiol  (VIVELLE -DOT) 0.05 MG/24HR patch, Place 1 patch (0.05 mg total) onto the skin 2 (two) times a week.    Current Outpatient Medications (Analgesics):    meloxicam  (MOBIC ) 15 MG tablet, Take 1 tablet (15 mg total) by mouth daily.   Current Outpatient Medications (Other):    Cholecalciferol (VITAMIN D  PO), Take by mouth.   clobetasol  ointment (TEMOVATE ) 0.05 %, Apply 1 Application topically daily. After 6 weeks of use, decrease  to twice a week application to the vulva.   estradiol  (ESTRACE  VAGINAL) 0.1 MG/GM vaginal cream, Place 1 g vaginally 3 (three) times a week.   MAGNESIUM PO, Take by mouth.   Probiotic Product (PROBIOTIC PO), Take by mouth.   S-Adenosylmethionine (SAM-E PO), Take by mouth.   UNABLE TO FIND, Med Name: b vitamin po   Reviewed prior external information including notes and imaging from  primary care provider As well as notes that were available from care everywhere and other healthcare systems.  Past medical history, social, surgical and family history all reviewed in electronic medical record.  No pertanent information unless stated regarding to the chief complaint.   Review of Systems:  No headache, visual changes, nausea, vomiting, diarrhea, constipation, dizziness, abdominal  pain, skin rash, fevers, chills, night sweats, weight loss, swollen lymph nodes, body aches, joint swelling, chest pain, shortness of breath, mood changes. POSITIVE muscle aches  Objective  Blood pressure 112/82, pulse 72, height 5' 4 (1.626 m), weight 162 lb (73.5 kg), SpO2 98%.   General: No apparent distress alert and oriented x3 mood and affect normal, dressed appropriately.  HEENT: Pupils equal, extraocular movements intact  Respiratory: Patient's speak in full sentences and does not appear short of breath  Cardiovascular: No lower extremity edema, non tender, no erythema  Right foot exam shows that there was some bruising noted to the forefoot.  Patient does have some limited range of motion of the 1st and 2nd toes in dorsiflexion and plantarflexion secondary to discomfort.  Neurovascularly intact distally.  Left knee exam still shows some laxity noted of the MCL.  Negative McMurray's.  ACL does appear to be intact which is an improvement from previous exam.  Limited muscular skeletal ultrasound was performed and interpreted by Kristin HUSSAR, M  Limited ultrasound of patient's right foot shows significant hypoechoic changes of the 1st, 2nd and 3rd MCP joints tenderness again for sprain.  No cortical irregularity noted of any of the bones.  Sesamoid bone of the plantar aspect appears to be unremarkable.   Impression and Recommendations:    The above documentation has been reviewed and is accurate and complete Kristin Cain M Kristin Pohl, DO

## 2023-12-26 ENCOUNTER — Encounter: Payer: Self-pay | Admitting: Family Medicine

## 2023-12-26 ENCOUNTER — Other Ambulatory Visit: Payer: Self-pay

## 2023-12-26 ENCOUNTER — Ambulatory Visit (INDEPENDENT_AMBULATORY_CARE_PROVIDER_SITE_OTHER): Admitting: Family Medicine

## 2023-12-26 VITALS — BP 112/82 | HR 72 | Ht 64.0 in | Wt 162.0 lb

## 2023-12-26 DIAGNOSIS — S93601A Unspecified sprain of right foot, initial encounter: Secondary | ICD-10-CM | POA: Diagnosis not present

## 2023-12-26 DIAGNOSIS — M25362 Other instability, left knee: Secondary | ICD-10-CM | POA: Diagnosis not present

## 2023-12-26 DIAGNOSIS — M79671 Pain in right foot: Secondary | ICD-10-CM

## 2023-12-26 NOTE — Patient Instructions (Signed)
 Post op shoe Rigid soled shoe Carbon fiber plate Wear knee brace with walking for 2 weeks and working out for 6 weeks See us  in November

## 2023-12-26 NOTE — Assessment & Plan Note (Signed)
 Some improvement noted.  Patient still has some weakness.  Discussed wearing the brace.  Seems that the meniscus is better but the MCL still has some potential tearing noted.  ACL that does appear to be much more intact now that the swelling is improved.  Discussed with patient about icing regimen and home exercises otherwise, increase activity slowly.  Discussed icing regimen.  Follow-up again in 6 to 8 weeks otherwise.

## 2023-12-26 NOTE — Assessment & Plan Note (Signed)
 Patient does have a forefoot sprain noted of the toes 1 through 4 with hypoechoic changes consistent with effusion of the joints.  Discussed a postop shoe, discussed rigid soled shoe or even a carbon fiber plate.  Increase activity slowly.  Discussed icing regimen, increase activity slowly.  Follow-up again in 6 to 8 weeks otherwise.

## 2024-02-23 ENCOUNTER — Ambulatory Visit: Admitting: Family Medicine

## 2024-05-13 ENCOUNTER — Encounter: Payer: Self-pay | Admitting: Family Medicine

## 2024-05-13 ENCOUNTER — Ambulatory Visit: Admitting: Family Medicine

## 2024-05-13 VITALS — BP 92/64 | HR 80 | Temp 97.6°F | Ht 64.0 in | Wt 154.0 lb

## 2024-05-13 DIAGNOSIS — Z7689 Persons encountering health services in other specified circumstances: Secondary | ICD-10-CM

## 2024-05-13 DIAGNOSIS — L811 Chloasma: Secondary | ICD-10-CM | POA: Insufficient documentation

## 2024-05-13 DIAGNOSIS — Z808 Family history of malignant neoplasm of other organs or systems: Secondary | ICD-10-CM | POA: Insufficient documentation

## 2024-05-13 DIAGNOSIS — Z87898 Personal history of other specified conditions: Secondary | ICD-10-CM | POA: Insufficient documentation

## 2024-05-13 DIAGNOSIS — E785 Hyperlipidemia, unspecified: Secondary | ICD-10-CM

## 2024-05-13 DIAGNOSIS — K219 Gastro-esophageal reflux disease without esophagitis: Secondary | ICD-10-CM | POA: Insufficient documentation

## 2024-05-13 DIAGNOSIS — L719 Rosacea, unspecified: Secondary | ICD-10-CM | POA: Insufficient documentation

## 2024-05-13 DIAGNOSIS — D225 Melanocytic nevi of trunk: Secondary | ICD-10-CM | POA: Insufficient documentation

## 2024-05-13 DIAGNOSIS — D2262 Melanocytic nevi of left upper limb, including shoulder: Secondary | ICD-10-CM | POA: Insufficient documentation

## 2024-05-13 DIAGNOSIS — R1011 Right upper quadrant pain: Secondary | ICD-10-CM | POA: Insufficient documentation

## 2024-05-13 NOTE — Progress Notes (Signed)
 "  New Patient Office Visit  Subjective    Patient ID: Kristin Cain, female    DOB: 08/06/1970  Age: 54 y.o. MRN: [id number]  CC:  Chief Complaint  Patient presents with   Establish Care    Discussed the use of AI scribe software for clinical note transcription with the patient, who gave verbal consent to proceed.  History of Present Illness Kristin Cain is a 54 year old female who presents to establish care.  Hypotension - Chronic low blood pressure readings without symptoms   Dyslipidemia - elevated LDL cholesterol international in past  - Elevated HDL cholesterol. - Was told she has favorable cholesterol particle size. - Plans to repeat cholesterol testing in six months at [facility]   Hormone therapy history - Previously took testosterone  and progesterone   - Discontinued hormone therapy due to weight gain and puffiness. - Experienced slight increase in energy while on therapy. - Currently feels well off hormones.  Musculoskeletal limitations - Physically active and lifts weights twice weekly. - Uses a brace for ACL problem. - Unable to run due to ACL issue, resulting in reduced walking.  [facility] and not due until 2027   [facility] annually   OB/GYN   Retired. Previously worked as herbalist Married. Adult daughters   Denies smoking hx. Rare alcohol       Outpatient Encounter Medications as of 05/13/2024  Medication Sig   B Complex Vitamins (VITAMIN B COMPLEX PO) Take by mouth.   Cholecalciferol (VITAMIN D  PO) Take by mouth.   clobetasol  ointment (TEMOVATE ) 0.05 % Apply 1 Application topically daily. After 6 weeks of use, decrease to twice a week application to the vulva.   estradiol  (ESTRACE  VAGINAL) 0.1 MG/GM vaginal cream Place 1 g vaginally 3 (three) times a week.   MAGNESIUM PO Take by mouth.   meloxicam  (MOBIC ) 15 MG tablet Take 1 tablet (15 mg total) by mouth daily.   Probiotic Product (PROBIOTIC  PO) Take by mouth.   [DISCONTINUED] estradiol  (VIVELLE -DOT) 0.05 MG/24HR patch Place 1 patch (0.05 mg total) onto the skin 2 (two) times a week.   [DISCONTINUED] S-Adenosylmethionine (SAM-E PO) Take by mouth.   [DISCONTINUED] UNABLE TO FIND Med Name: b vitamin po   No facility-administered encounter medications on file as of 05/13/2024.    Past Medical History:  Diagnosis Date   Family history of skin cancer 05/13/2024   History of neoplasm 05/13/2024   Lichen sclerosus 02/2019   Biopsy-proven   Melanocytic nevus of trunk 05/13/2024   Melasma 05/13/2024   Nevus of left upper arm 05/13/2024   Scabies    Seasonal allergies     Past Surgical History:  Procedure Laterality Date   APPENDECTOMY     CERVICAL BIOPSY  W/ LOOP ELECTRODE EXCISION     CESAREAN SECTION     x 2 BTL   COLPOSCOPY     ENDOMETRIAL ABLATION  2007   LAPAROSCOPIC TOTAL HYSTERECTOMY  03/2010   PELVIC LAPAROSCOPY     WISDOM TOOTH EXTRACTION      Family History  Problem Relation Age of Onset   Diabetes Maternal Grandmother    Hyperlipidemia Mother    Hypertension Mother    Hyperlipidemia Father    Hypertension Father    Breast cancer Neg Hx     Social History   Socioeconomic History   Marital status: Married    Spouse name: Not on file   Number of children: Not on file  Years of education: Not on file   Highest education level: Not on file  Occupational History   Not on file  Tobacco Use   Smoking status: Never    Passive exposure: Never   Smokeless tobacco: Never  Vaping Use   Vaping status: Never Used  Substance and Sexual Activity   Alcohol use: Yes    Comment: 1-2 times a month   Drug use: No   Sexual activity: Yes    Birth control/protection: Surgical    Comment: HYST-1st intercourse 15 yo--5 partners, MARRIED- 53 YRS  Other Topics Concern   Not on file  Social History Narrative   Not on file   Social Drivers of Health   Tobacco Use: Low Risk (05/13/2024)   Patient History     Smoking Tobacco Use: Never    Smokeless Tobacco Use: Never    Passive Exposure: Never  Financial Resource Strain: Not on file  Food Insecurity: Not on file  Transportation Needs: Not on file  Physical Activity: Not on file  Stress: Not on file  Social Connections: Not on file  Intimate Partner Violence: Not on file  Depression (PHQ2-9): Low Risk (05/13/2024)   Depression (PHQ2-9)    PHQ-2 Score: 0  Alcohol Screen: Not on file  Housing: Not on file  Utilities: Not on file  Health Literacy: Not on file    Review of Systems  Constitutional:  Negative for chills, fever, malaise/fatigue and weight loss.  Respiratory:  Negative for shortness of breath.   Cardiovascular:  Negative for chest pain, palpitations and leg swelling.  Gastrointestinal:  Negative for abdominal pain, constipation, diarrhea, nausea and vomiting.  Genitourinary:  Negative for dysuria, frequency and urgency.  Musculoskeletal:  Positive for joint pain.  Neurological:  Negative for dizziness, focal weakness and headaches.  Psychiatric/Behavioral:  Negative for depression. The patient is not nervous/anxious.         Objective    BP 92/64   Pulse 80   Temp 97.6 F (36.4 C) (Temporal)   Ht 5' 4 (1.626 m)   Wt 154 lb (69.9 kg)   SpO2 98%   BMI 26.43 kg/m   Physical Exam Constitutional:      General: She is not in acute distress.    Appearance: She is not ill-appearing.  Eyes:     Extraocular Movements: Extraocular movements intact.     Conjunctiva/sclera: Conjunctivae normal.  Cardiovascular:     Rate and Rhythm: Normal rate.  Pulmonary:     Effort: Pulmonary effort is normal.  Musculoskeletal:     Cervical back: Normal range of motion and neck supple.  Skin:    General: Skin is warm and dry.  Neurological:     General: No focal deficit present.     Mental Status: She is alert and oriented to person, place, and time.  Psychiatric:        Mood and Affect: Mood normal.        Behavior: Behavior  normal.        Thought Content: Thought content normal.         Assessment & Plan:   Problem List Items Addressed This Visit   None Visit Diagnoses       Hyperlipidemia, unspecified hyperlipidemia type    -  Primary     Encounter to establish care           Assessment and Plan Assessment & Plan Establishing care New patient establishing care with no significant health issues. Gynecological care  managed by OB GYN. No current concerns. - Schedule a complete physical examination to review medical history and medications. - Requested previous colonoscopy records from [facility]. - Will review lab results from [facility] when she drops them off  Hyperlipidemia Recent LDL level elevated.  HDL levels are high.  Recent dietary indiscretions noted. Further testing at [facility] showed low particle size, which is favorable. Plan to recheck cholesterol in six months. Not on statin therapy  - Will review lab results from [facility] once I have them - Will discuss cardiovascular risk management at next visit.  General Health Maintenance She is a pleasant 54 year old female who was in her usual state of health and here to establish care.  Immunizations are up to date with tdap received 6-7 years ago. Colonoscopy is due in 2027. Pap smears as managed by OB GYN. - Requested previous colonoscopy records from [facility] - Will discuss immunization status at next visit.     Return for fasting CPE at their convenience.   Boby Mackintosh, NP-C   "

## 2024-05-18 ENCOUNTER — Ambulatory Visit: Admitting: Family Medicine
